# Patient Record
Sex: Female | Born: 1990
Health system: Southern US, Community
[De-identification: ages and names within clinical notes are randomized; demographics above are authoritative.]

## PROBLEM LIST (undated history)

## (undated) ENCOUNTER — Inpatient Hospital Stay (HOSPITAL_COMMUNITY): Payer: Self-pay

## (undated) DIAGNOSIS — O139 Gestational [pregnancy-induced] hypertension without significant proteinuria, unspecified trimester: Secondary | ICD-10-CM

## (undated) DIAGNOSIS — K219 Gastro-esophageal reflux disease without esophagitis: Secondary | ICD-10-CM

## (undated) DIAGNOSIS — F419 Anxiety disorder, unspecified: Secondary | ICD-10-CM

## (undated) DIAGNOSIS — R87629 Unspecified abnormal cytological findings in specimens from vagina: Secondary | ICD-10-CM

## (undated) DIAGNOSIS — Z87442 Personal history of urinary calculi: Secondary | ICD-10-CM

## (undated) DIAGNOSIS — G43909 Migraine, unspecified, not intractable, without status migrainosus: Secondary | ICD-10-CM

## (undated) HISTORY — PX: HERNIA REPAIR: SHX51

## (undated) HISTORY — DX: Gastro-esophageal reflux disease without esophagitis: K21.9

## (undated) HISTORY — PX: NECK SURGERY: SHX720

## (undated) HISTORY — DX: Personal history of urinary calculi: Z87.442

## (undated) HISTORY — DX: Anxiety disorder, unspecified: F41.9

## (undated) HISTORY — PX: SPINE SURGERY: SHX786

---
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
## 2013-01-03 ENCOUNTER — Emergency Department (HOSPITAL_BASED_OUTPATIENT_CLINIC_OR_DEPARTMENT_OTHER)
Admission: EM | Admit: 2013-01-03 | Discharge: 2013-01-04 | Disposition: A | Discharge status: Home / Self Care | Payer: Managed Care, Other (non HMO) | Attending: Emergency Medicine | Admitting: Emergency Medicine

## 2013-01-03 ENCOUNTER — Encounter (HOSPITAL_BASED_OUTPATIENT_CLINIC_OR_DEPARTMENT_OTHER): Payer: Self-pay | Admitting: Emergency Medicine

## 2013-01-03 DIAGNOSIS — Y9389 Activity, other specified: Secondary | ICD-10-CM | POA: Insufficient documentation

## 2013-01-03 DIAGNOSIS — S61209A Unspecified open wound of unspecified finger without damage to nail, initial encounter: Secondary | ICD-10-CM | POA: Insufficient documentation

## 2013-01-03 DIAGNOSIS — W260XXA Contact with knife, initial encounter: Secondary | ICD-10-CM | POA: Insufficient documentation

## 2013-01-03 DIAGNOSIS — Z79899 Other long term (current) drug therapy: Secondary | ICD-10-CM | POA: Insufficient documentation

## 2013-01-03 DIAGNOSIS — G43909 Migraine, unspecified, not intractable, without status migrainosus: Secondary | ICD-10-CM | POA: Insufficient documentation

## 2013-01-03 DIAGNOSIS — Y929 Unspecified place or not applicable: Secondary | ICD-10-CM | POA: Insufficient documentation

## 2013-01-03 DIAGNOSIS — S61012A Laceration without foreign body of left thumb without damage to nail, initial encounter: Secondary | ICD-10-CM

## 2013-01-03 HISTORY — DX: Migraine, unspecified, not intractable, without status migrainosus: G43.909

## 2013-01-03 NOTE — ED Notes (Signed)
Pt has laceration to left thumb with knife tonight.

## 2013-01-04 NOTE — ED Provider Notes (Signed)
  CSN: [id number]     Arrival date & time 01/03/13  2309 History     First MD Initiated Contact with Patient 01/03/13 2341     Chief Complaint  Patient presents with  . Laceration   (Consider location/radiation/quality/duration/timing/severity/associated sxs/prior Treatment) HPI Pt presents with laceration to the tip of her left thumb.  She states she was cutting a watermelon and the knife slipped.  Injury occurred earlier tonight.  Bleeding controlled with pressure.  Pain with palpation.  Pt states her last tetanus shot was 3 years ago.  There are no other associated systemic symptoms, there are no other alleviating or modifying factors.   Past Medical History  Diagnosis Date  . Migraine    Past Surgical History  Procedure Laterality Date  . Hernia repair     No family history on file. History  Substance Use Topics  . Smoking status: Never Smoker   . Smokeless tobacco: Not on file  . Alcohol Use: Yes   OB History   Grav Para Term Preterm Abortions TAB SAB Ect Mult Living                 Review of Systems ROS reviewed and all otherwise negative except for mentioned in HPI  Allergies  Review of patient's allergies indicates no known allergies.  Home Medications   Current Outpatient Rx  Name  Route  Sig  Dispense  Refill  . hydrOXYzine (VISTARIL) 25 MG capsule   Oral   Take 25 mg by mouth as needed for itching.         . Norethindrone Acet-Ethinyl Est (JUNEL 1/20 PO)   Oral   Take by mouth.         . nortriptyline (PAMELOR) 75 MG capsule   Oral   Take 75 mg by mouth at bedtime.          BP 124/84  Pulse 70  Temp(Src) 98.2 F (36.8 C) (Oral)  Resp 18  SpO2 100% Vitals reviewed Physical Exam Physical Examination: General appearance - alert, well appearing, and in no distress Mental status - alert, oriented to person, place, and time Eyes - no conjunctival injection, no scleral icterus Extremities - peripheral pulses normal, no pedal edema, no  clubbing or cyanosis Skin - normal coloration and turgor, no rashes, approx 1cm linear laceration over pad of left thumb  ED Course   Procedures (including critical care time)  LACERATION REPAIR Performed by: Ethelda Chick Authorized by: Ethelda Chick Consent: Verbal consent obtained. Risks and benefits: risks, benefits and alternatives were discussed Consent given by: patient Patient identity confirmed: provided demographic data Prepped and Draped in normal sterile fashion Wound explored  Laceration Location: left thumb  Laceration Length: 1cm  No Foreign Bodies seen or palpated  Irrigation method: syringe Amount of cleaning: standard  Skin closure: dermabond  Number of sutures: dermabond  Technique: dermabond, steri strips  Patient tolerance: Patient tolerated the procedure well with no immediate complications.  Labs Reviewed - No data to display No results found. 1. Laceration of left thumb, initial encounter     MDM  Pt presenting with laceration of left thumb with kitchen knife.  Good wound approximation.  Wound closed with dermabond and steristrips applied.  Tetanus is up to date. Discharged with strict return precautions.  Pt agreeable with plan.  Ethelda Chick, MD 01/04/13 [id number]

## 2015-03-25 DIAGNOSIS — R87619 Unspecified abnormal cytological findings in specimens from cervix uteri: Secondary | ICD-10-CM | POA: Insufficient documentation

## 2015-03-25 DIAGNOSIS — K581 Irritable bowel syndrome with constipation: Secondary | ICD-10-CM | POA: Insufficient documentation

## 2015-03-25 DIAGNOSIS — G43909 Migraine, unspecified, not intractable, without status migrainosus: Secondary | ICD-10-CM | POA: Insufficient documentation

## 2015-09-25 DIAGNOSIS — G43009 Migraine without aura, not intractable, without status migrainosus: Secondary | ICD-10-CM | POA: Insufficient documentation

## 2015-09-25 DIAGNOSIS — M5116 Intervertebral disc disorders with radiculopathy, lumbar region: Secondary | ICD-10-CM | POA: Insufficient documentation

## 2015-10-21 ENCOUNTER — Other Ambulatory Visit: Payer: Self-pay | Admitting: Internal Medicine

## 2015-10-21 ENCOUNTER — Ambulatory Visit
Admission: RE | Admit: 2015-10-21 | Discharge: 2015-10-21 | Disposition: A | Discharge status: Home / Self Care | Payer: BLUE CROSS/BLUE SHIELD | Source: Ambulatory Visit | Attending: Internal Medicine | Admitting: Internal Medicine

## 2015-10-21 DIAGNOSIS — K5900 Constipation, unspecified: Secondary | ICD-10-CM

## 2015-10-27 ENCOUNTER — Ambulatory Visit
Admission: RE | Admit: 2015-10-27 | Discharge: 2015-10-27 | Disposition: A | Discharge status: Home / Self Care | Payer: BLUE CROSS/BLUE SHIELD | Source: Ambulatory Visit | Attending: Internal Medicine | Admitting: Internal Medicine

## 2015-10-27 ENCOUNTER — Other Ambulatory Visit: Payer: Self-pay | Admitting: Internal Medicine

## 2015-10-27 DIAGNOSIS — R109 Unspecified abdominal pain: Secondary | ICD-10-CM

## 2015-10-27 DIAGNOSIS — K5900 Constipation, unspecified: Secondary | ICD-10-CM

## 2017-05-05 IMAGING — CR DG ABDOMEN 2V
2 series · 2 of 2 positions shown · non-contrast
Comparison: 01/06/2004

CLINICAL DATA: Chronic constipation, assess fecal burden

EXAM:
ABDOMEN - 2 VIEW

[w abdomen upright]
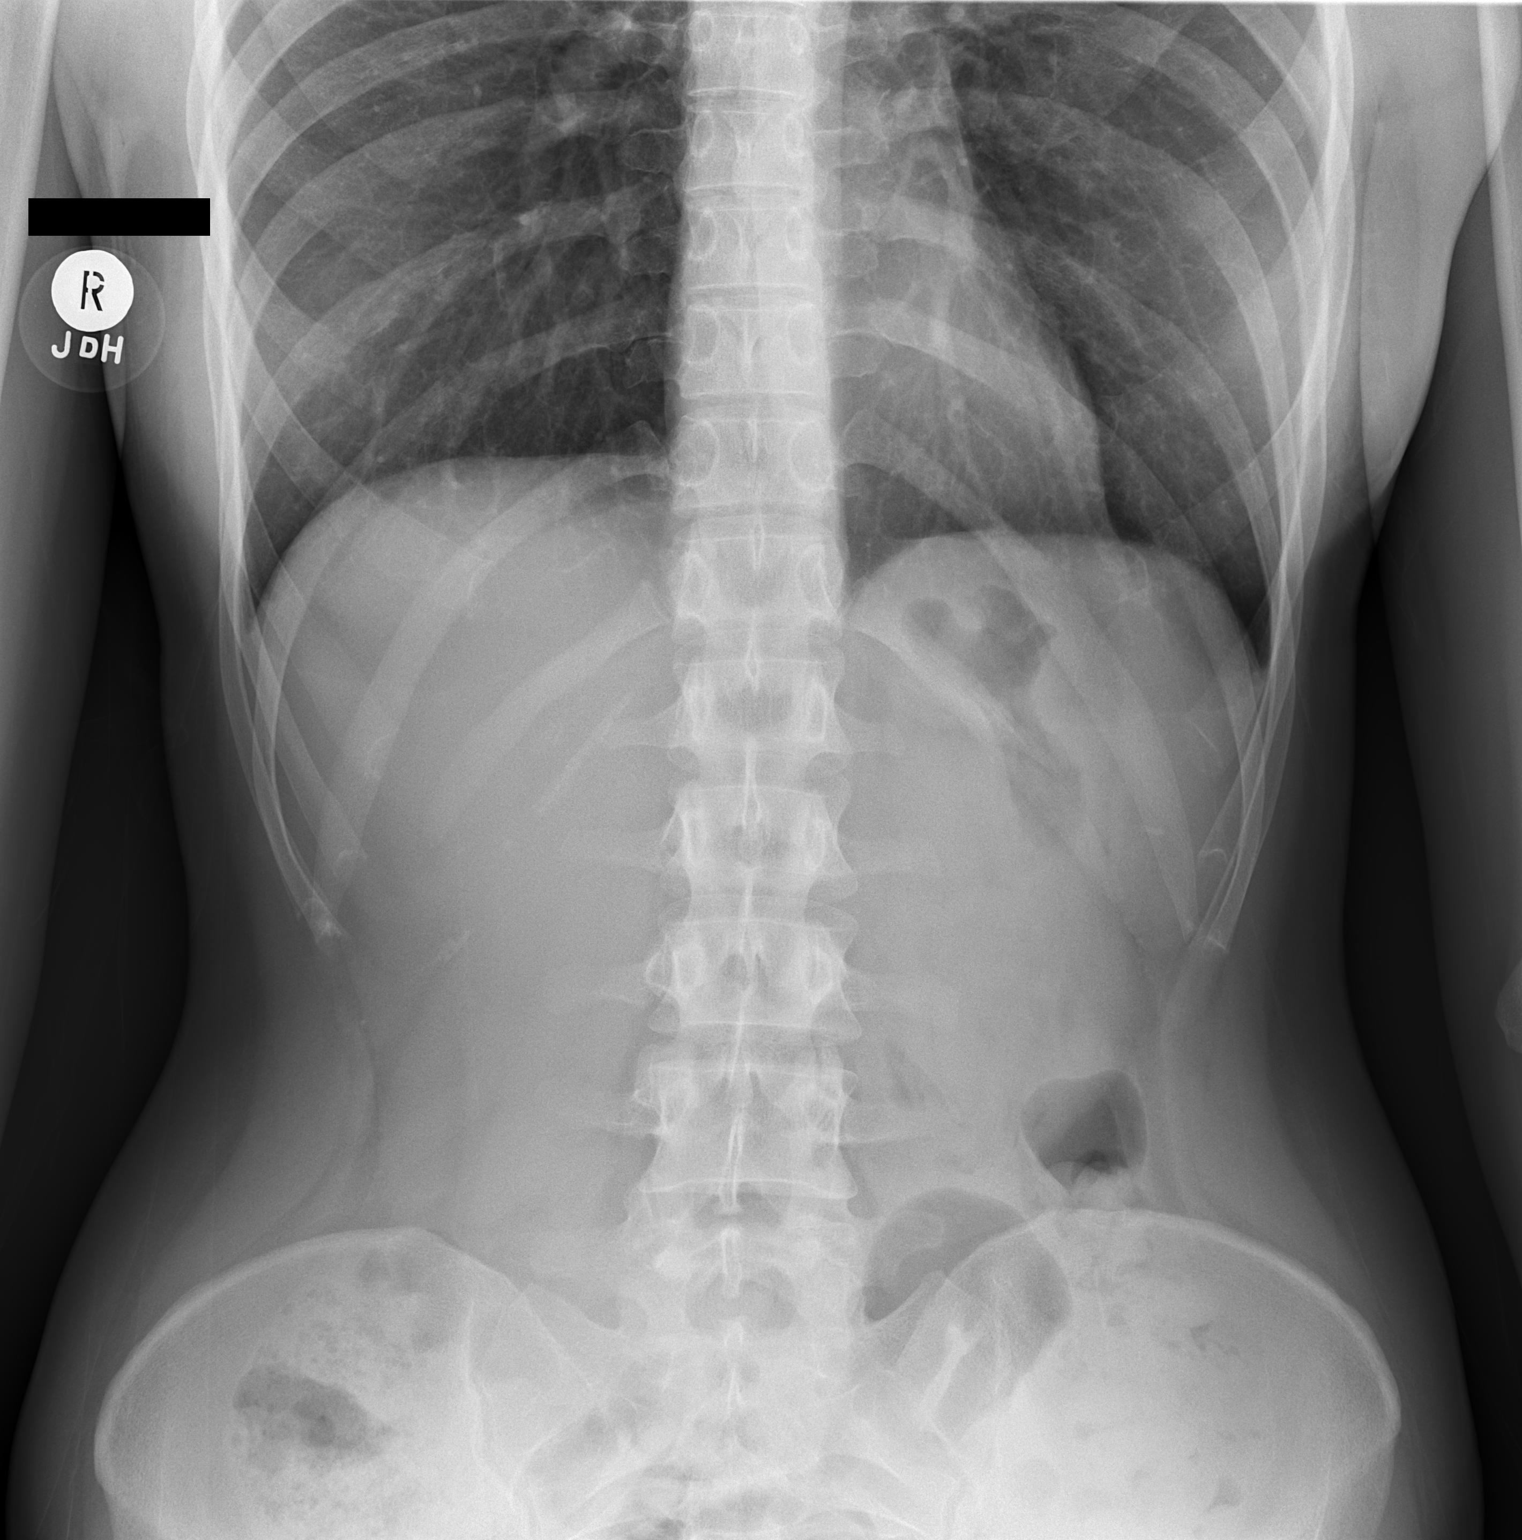

[t abdomen supine]
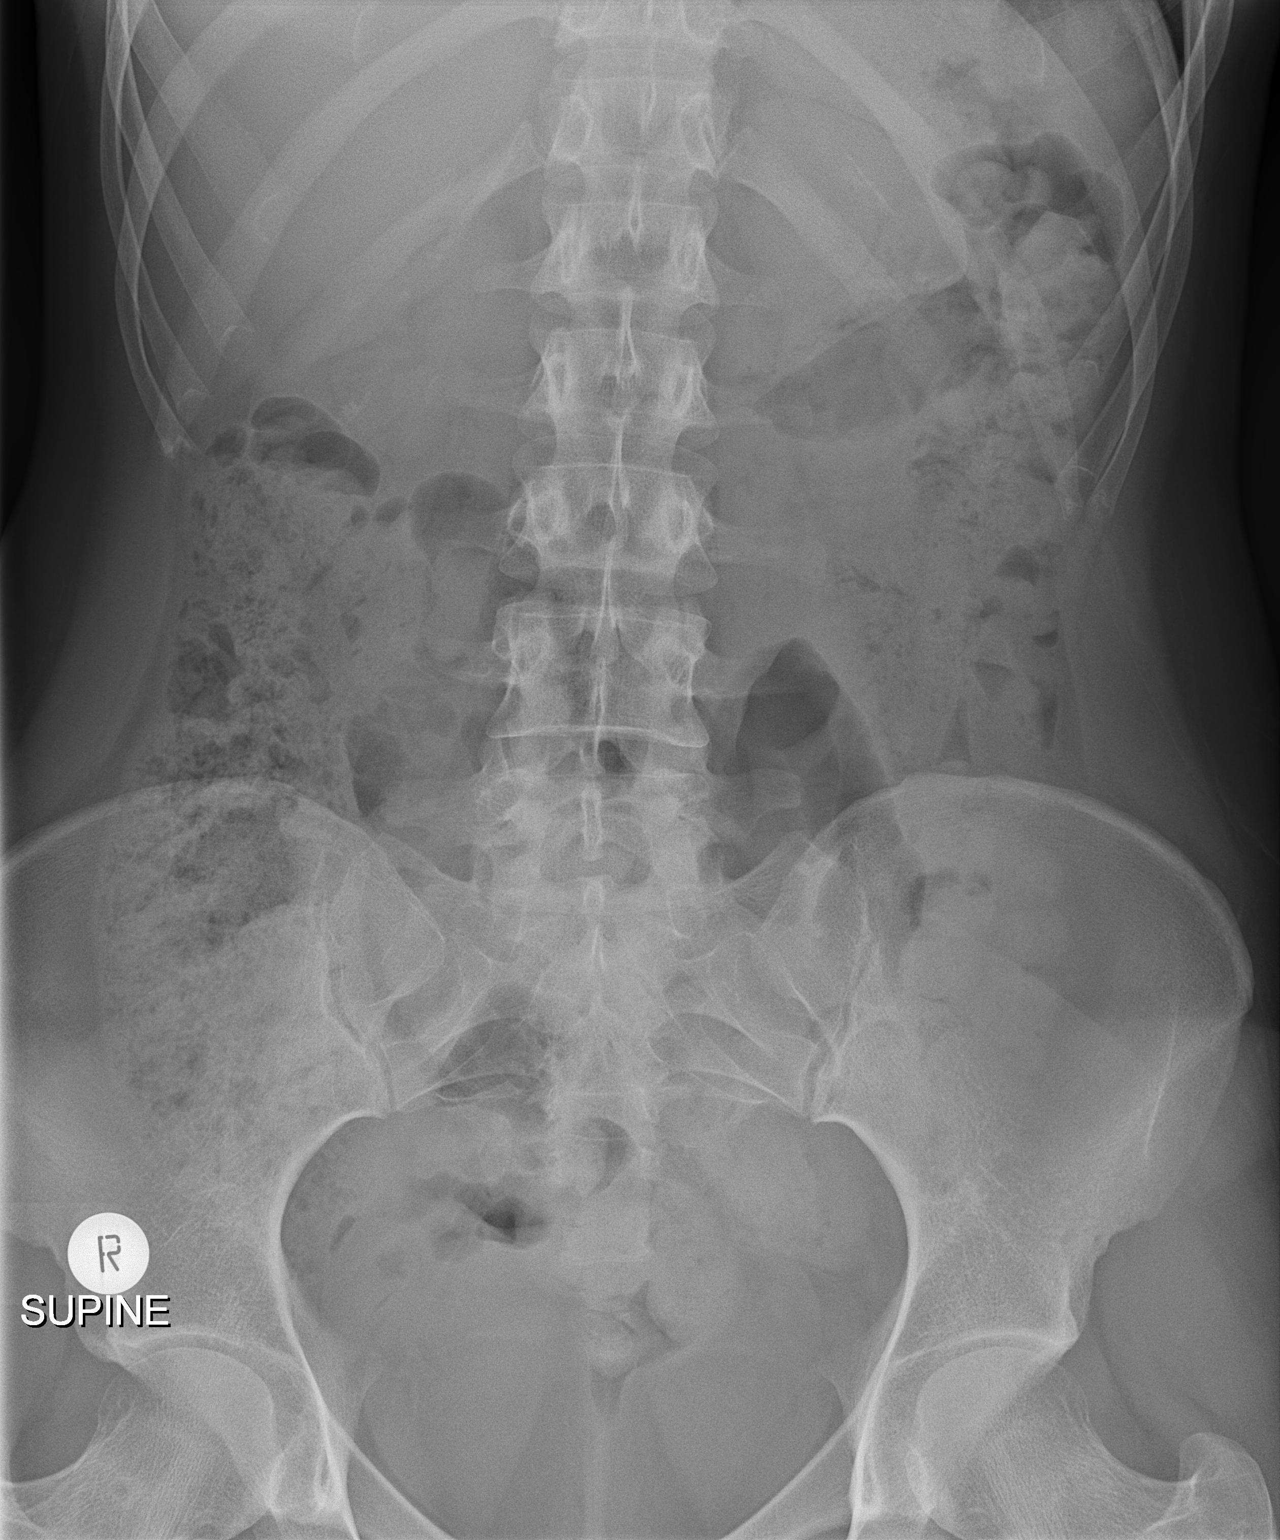

[2 of 2 positions shown; findings below may reference images not displayed]

FINDINGS: There is normal small bowel gas pattern. Abundant stool noted in
right colon. Moderate stool noted in splenic flexure and proximal
left colon. No evidence of free abdominal air.
IMPRESSION: Normal small bowel gas pattern.  Colonic stool as described above.

## 2017-05-19 ENCOUNTER — Encounter: Payer: Self-pay | Admitting: Podiatry

## 2017-05-19 ENCOUNTER — Ambulatory Visit: Payer: BLUE CROSS/BLUE SHIELD | Admitting: Podiatry

## 2017-05-19 VITALS — BP 120/89 | HR 101 | Ht 70.0 in | Wt 150.0 lb

## 2017-05-19 DIAGNOSIS — L600 Ingrowing nail: Secondary | ICD-10-CM | POA: Diagnosis not present

## 2017-05-19 DIAGNOSIS — B351 Tinea unguium: Secondary | ICD-10-CM

## 2017-05-19 NOTE — Progress Notes (Signed)
  Subjective:  Patient ID: Norma Weber, female    DOB: 1991-01-22,  MRN: [id number]  Chief Complaint  Patient presents with  . Nail Problem    left foot toenail fungus/great toe -has become painful    27 y.o. female presents with and ingrown toenail to the left great toe.  Has been painful for some time.  Reports that she thinks she has a fungus in her nail. Past Medical History:  Diagnosis Date  . Migraine    Past Surgical History:  Procedure Laterality Date  . HERNIA REPAIR      Current Outpatient Medications:  .  ALPRAZolam (XANAX) 0.25 MG tablet, Take by mouth., Disp: , Rfl:  .  linaclotide (LINZESS) 72 MCG capsule, TAKE 1 CAPSULE BY MOUTH EVERY DAY, Disp: , Rfl:  .  norgestimate-ethinyl estradiol (SPRINTEC 28) 0.25-35 MG-MCG tablet, Take by mouth., Disp: , Rfl:  .  hydrOXYzine (VISTARIL) 25 MG capsule, Take 25 mg by mouth as needed for itching., Disp: , Rfl:  .  ibuprofen (ADVIL,MOTRIN) 800 MG tablet, Take by mouth., Disp: , Rfl:  .  Norethindrone Acet-Ethinyl Est (JUNEL 1/20 PO), Take by mouth., Disp: , Rfl:  .  nortriptyline (PAMELOR) 75 MG capsule, Take 75 mg by mouth at bedtime., Disp: , Rfl:   No Known Allergies Review of Systems all other systems reviewed and are negative except as noted Objective:   Vitals:   05/19/17 1058  BP: 120/89  Pulse: (!) 101   General AA&O x3. Normal mood and affect.  Vascular Dorsalis pedis and posterior tibial pulses  present 2+ bilaterally  Capillary refill normal to all digits. Pedal hair growth normal.  Neurologic Epicritic sensation grossly present.  Dermatologic No open lesions. Interspaces clear of maceration. Nails well groomed and normal in appearance. Painful ingrowing nail at both nail borders of the hallux nail left.  Orthopedic: MMT 5/5 in dorsiflexion, plantarflexion, inversion, and eversion. Normal joint ROM without pain or crepitus. Pain to palpation about the ingrown nail.   Assessment & Plan:  Patient was  evaluated and treated and all questions answered.  Ingrown Nail, left -Patient elects to proceed with ingrown toenail removal today -Ingrown nail excised. See procedure note. -Educated on post-procedure care including soaking. Written instructions provided. -Patient to follow up in 2 weeks for nail check.  Onychomycosis -Nail sample sent for culture and micro  Procedure: Excision of Ingrown Toenail Location: Left 1st toe both nail borders. Anesthesia: Lidocaine 1% plain; 1.5 mL and Marcaine 0.5% plain; 1.5 mL, digital block. Skin Prep: Betadine. Dressing: Silvadene; telfa; dry, sterile, compression dressing. Technique: Following skin prep, the toe was exsanguinated and a tourniquet was secured at the base of the toe. The affected nail border was freed, split with a nail splitter, and excised. Chemical matrixectomy was then performed with phenol and irrigated out with alcohol. The tourniquet was then removed and sterile dressing applied. Disposition: Patient tolerated procedure well. Patient to return in 2 weeks for follow-up.   Return in about 2 weeks (around 06/02/2017) for Nail Check.

## 2017-05-19 NOTE — Patient Instructions (Signed)
Place 1/4 cup of epsom salts in a quart of warm tap water.  Submerge your foot or feet in the solution and soak for 20 minutes.  This soak should be done twice a day.  Next, remove your foot or feet from solution, blot dry the affected area. Apply ointment and cover if instructed by your doctor.   IF YOUR SKIN BECOMES IRRITATED WHILE USING THESE INSTRUCTIONS, IT IS OKAY TO SWITCH TO  WHITE VINEGAR AND WATER.  As another alternative soak, you may use antibacterial soap and water.  Monitor for any signs/symptoms of infection. Call the office immediately if any occur or go directly to the emergency room. Call with any questions/concerns.  Ingrown Toenail An ingrown toenail occurs when the corner or sides of your toenail grow into the surrounding skin. The big toe is most commonly affected, but it can happen to any of your toes. If your ingrown toenail is not treated, you will be at risk for infection. What are the causes? This condition may be caused by:  Wearing shoes that are too small or tight.  Injury or trauma, such as stubbing your toe or having your toe stepped on.  Improper cutting or care of your toenails.  Being born with (congenital) nail or foot abnormalities, such as having a nail that is too big for your toe.  What increases the risk? Risk factors for an ingrown toenail include:  Age. Your nails tend to thicken as you get older, so ingrown nails are more common in older people.  Diabetes.  Cutting your toenails incorrectly.  Blood circulation problems.  What are the signs or symptoms? Symptoms may include:  Pain, soreness, or tenderness.  Redness.  Swelling.  Hardening of the skin surrounding the toe.  Your ingrown toenail may be infected if there is fluid, pus, or drainage. How is this diagnosed? An ingrown toenail may be diagnosed by medical history and physical exam. If your toenail is infected, your health care provider may test a sample of the  drainage. How is this treated? Treatment depends on the severity of your ingrown toenail. Some ingrown toenails may be treated at home. More severe or infected ingrown toenails may require surgery to remove all or part of the nail. Infected ingrown toenails may also be treated with antibiotic medicines. Follow these instructions at home:  If you were prescribed an antibiotic medicine, finish all of it even if you start to feel better.  Soak your foot in warm soapy water for 20 minutes, 3 times per day or as directed by your health care provider.  Carefully lift the edge of the nail away from the sore skin by wedging a small piece of cotton under the corner of the nail. This may help with the pain. Be careful not to cause more injury to the area.  Wear shoes that fit well. If your ingrown toenail is causing you pain, try wearing sandals, if possible.  Trim your toenails regularly and carefully. Do not cut them in a curved shape. Cut your toenails straight across. This prevents injury to the skin at the corners of the toenail.  Keep your feet clean and dry.  If you are having trouble walking and are given crutches by your health care provider, use them as directed.  Do not pick at your toenail or try to remove it yourself.  Take medicines only as directed by your health care provider.  Keep all follow-up visits as directed by your health care provider. This   is important. Contact a health care provider if:  Your symptoms do not improve with treatment. Get help right away if:  You have red streaks that start at your foot and go up your leg.  You have a fever.  You have increased redness, swelling, or pain.  You have fluid, blood, or pus coming from your toenail. This information is not intended to replace advice given to you by your health care provider. Make sure you discuss any questions you have with your health care provider. Document Released: 04/30/2000 Document Revised:  10/03/2015 Document Reviewed: 03/27/2014 Elsevier Interactive Patient Education  2018 [employer]  

## 2017-05-20 ENCOUNTER — Telehealth: Payer: Self-pay | Admitting: Podiatry

## 2017-05-20 NOTE — Telephone Encounter (Signed)
I saw Dr. Samuella CotaPrice yesterday and he performed a nail procedure on my big toe. I just have a few questions regarding follow up care. If someone could return my call at [phone]. Thank you.

## 2017-05-20 NOTE — Telephone Encounter (Signed)
Pt states she began her soaks today and she still has quite a bit of drainage. I told pt she would have varying degrees of drainage for up to 4-6 weeks, to continue the soaks 2 x daily for 20 minutes, then pat dry and cover with an antibiotic ointment bandaid. I told pt the symptoms should gradually decrease and at about the end of the 4th week, she should do the last soak of the day, leave off the antibiotic ointment bandaid and allow to air dry, if no redness, swelling, pain or drainage then can stop the soaks, if symptoms continue then continue the soaks for another 2 weeks and test again.

## 2017-06-02 ENCOUNTER — Ambulatory Visit: Payer: 59 | Admitting: Podiatry

## 2017-06-02 ENCOUNTER — Encounter: Payer: Self-pay | Admitting: Podiatry

## 2017-06-02 DIAGNOSIS — B351 Tinea unguium: Secondary | ICD-10-CM

## 2017-06-02 MED ORDER — FLUCONAZOLE 150 MG PO TABS: 150.0000 mg | ORAL_TABLET | ORAL | 1 refills | Status: DC

## 2017-06-02 NOTE — Progress Notes (Signed)
  Subjective:  Patient ID: Norma Weber, female    DOB: 09-27-1990,  MRN: [id number]  Chief Complaint  Patient presents with  . Ingrown Toenail    2 week follow up   27 y.o. female returns for the above complaint. States that the nail is doing fine.  Objective:   General AA&O x3. Normal mood and affect.  Vascular Foot warm and well perfused with good capillary refill.  Neurologic Sensation grossly intact.  Dermatologic Nail avulsion site healing well without drainage or erythema. Nail bed with overlying soft crust. Left intact. No signs of local infection.  Orthopedic: No tenderness to palpation of the toe.   Assessment & Plan:  Patient was evaluated and treated and all questions answered.  S/p Ingrown Toenail Excision, left both borders. -Healing well without issue. -Discussed return precautions. -F/u PRN  Onychomycosis -Nail histology and microbiology reviewed. S/o Onychomycosis. -Rx weekly Fluconazole.

## 2017-06-07 DIAGNOSIS — F411 Generalized anxiety disorder: Secondary | ICD-10-CM | POA: Insufficient documentation

## 2017-06-07 DIAGNOSIS — F410 Panic disorder [episodic paroxysmal anxiety] without agoraphobia: Secondary | ICD-10-CM | POA: Insufficient documentation

## 2017-06-23 DIAGNOSIS — M5136 Other intervertebral disc degeneration, lumbar region: Secondary | ICD-10-CM | POA: Insufficient documentation

## 2017-07-14 ENCOUNTER — Ambulatory Visit: Payer: 59 | Admitting: Podiatry

## 2017-07-28 ENCOUNTER — Telehealth: Payer: Self-pay | Admitting: *Deleted

## 2017-07-28 ENCOUNTER — Ambulatory Visit: Payer: 59 | Admitting: Podiatry

## 2017-07-28 DIAGNOSIS — L600 Ingrowing nail: Secondary | ICD-10-CM | POA: Diagnosis not present

## 2017-07-28 DIAGNOSIS — B351 Tinea unguium: Secondary | ICD-10-CM | POA: Diagnosis not present

## 2017-07-28 MED ORDER — EFINACONAZOLE 10 % EX SOLN: CUTANEOUS | 11 refills | Status: DC

## 2017-07-28 NOTE — Progress Notes (Signed)
  Subjective:  Patient ID: Norma RipaJessica L Weber, female    DOB: January 24, 1991,  MRN: [id number]  Chief Complaint  Patient presents with  . Nail Problem    Toenails look a little better - had to stop taking fluconazole after 5 doses due to extremem dry mouth   27 y.o. female returns for the above complaint. States the nail fungus is improving.  Had to stop the fluconazole due to dryness from mouth dryness with her other medications.  Objective:  There were no vitals filed for this visit. General AA&O x3. Normal mood and affect.  Vascular Pedal pulses palpable.  Neurologic Epicritic sensation grossly intact.  Dermatologic Nail discoloration improving bilat hallux nails, R 2nd toenail  Orthopedic: No pain to palpation either foot.   Assessment & Plan:  Patient was evaluated and treated and all questions answered.  Onychomycosis -Improving -Courtesy nail debridement today with burr -D/c Fluconazole. Rx Jublia  Return in about 5 weeks (around 09/01/2017) for Nail Fungus.

## 2017-07-28 NOTE — Telephone Encounter (Signed)
Dr. Samuella CotaPrice ordered Norma Weber. Orders sent to [facility], [place].

## 2017-08-01 ENCOUNTER — Telehealth: Payer: Self-pay | Admitting: *Deleted

## 2017-08-01 NOTE — Telephone Encounter (Signed)
Neysa BonitoChristy - [facility] request copy of Fungal culture for pre-cert for the Jublia. Faxed copy of 05/19/2017 fungal results to [facility].

## 2017-08-05 NOTE — Telephone Encounter (Signed)
[employer] denied [place]ublia, suggest alternate topical Penlac/Ciclopirox.

## 2017-08-15 MED ORDER — CICLOPIROX 8 % EX SOLN: Freq: Every day | CUTANEOUS | 11 refills | Status: DC

## 2017-08-15 NOTE — Addendum Note (Signed)
Addended by: Alphia Kava'CONNELL, VALERY D on: 08/15/2017 02:49 PM   Modules accepted: Orders

## 2017-08-15 NOTE — Telephone Encounter (Signed)
Pt states she had missed my call.

## 2017-08-15 NOTE — Telephone Encounter (Addendum)
Left message informing pt the [employer] had denied the [place], and suggested penlac. Dr. Samuella CotaPrice orderd the penlac and [phone]

## 2017-08-15 NOTE — Telephone Encounter (Signed)
Sorry missed this. We can do Penlac

## 2017-08-15 NOTE — Telephone Encounter (Signed)
Left message to call for information concerning rx.

## 2017-09-01 ENCOUNTER — Encounter: Payer: Self-pay | Admitting: Podiatry

## 2017-09-01 ENCOUNTER — Ambulatory Visit: Payer: 59 | Admitting: Podiatry

## 2017-09-01 DIAGNOSIS — B351 Tinea unguium: Secondary | ICD-10-CM

## 2017-09-08 NOTE — Progress Notes (Signed)
  Subjective:  Patient ID: Norma Weber, female    DOB: 1991-02-07,  MRN: [id number]  Chief Complaint  Patient presents with  . Nail Problem    left foot f/u appt. doing better, yellowish color going away, using topical 1 week   27 y.o. female returns for the above complaint. States the fungus is improving. Color going away. Has been using topical medication. Jublia was not covered.  Objective:  There were no vitals filed for this visit. General AA&O x3. Normal mood and affect.  Vascular Pedal pulses palpable.  Neurologic Epicritic sensation grossly intact.  Dermatologic Nail discoloration improving bilat hallux nails, R 2nd toenail  Orthopedic: No pain to palpation either foot.   Assessment & Plan:  Patient was evaluated and treated and all questions answered.  Onychomycosis -Improving -Courtesy nail debridement today with burr -Continue topical.  Return if symptoms worsen or fail to improve.

## 2017-11-01 DIAGNOSIS — R310 Gross hematuria: Secondary | ICD-10-CM | POA: Insufficient documentation

## 2017-12-09 ENCOUNTER — Ambulatory Visit (HOSPITAL_COMMUNITY): Payer: BLUE CROSS/BLUE SHIELD | Admitting: Psychiatry

## 2017-12-09 ENCOUNTER — Encounter

## 2017-12-23 ENCOUNTER — Encounter (HOSPITAL_COMMUNITY): Payer: Self-pay | Admitting: Psychiatry

## 2017-12-23 ENCOUNTER — Ambulatory Visit (INDEPENDENT_AMBULATORY_CARE_PROVIDER_SITE_OTHER): Payer: 59 | Admitting: Psychiatry

## 2017-12-23 VITALS — BP 118/66 | HR 80 | Ht 69.5 in | Wt 138.0 lb

## 2017-12-23 DIAGNOSIS — Z818 Family history of other mental and behavioral disorders: Secondary | ICD-10-CM

## 2017-12-23 DIAGNOSIS — F411 Generalized anxiety disorder: Secondary | ICD-10-CM | POA: Diagnosis not present

## 2017-12-23 DIAGNOSIS — Q798 Other congenital malformations of musculoskeletal system: Secondary | ICD-10-CM

## 2017-12-23 DIAGNOSIS — R450 Nervousness: Secondary | ICD-10-CM

## 2017-12-23 DIAGNOSIS — K5900 Constipation, unspecified: Secondary | ICD-10-CM

## 2017-12-23 DIAGNOSIS — R002 Palpitations: Secondary | ICD-10-CM

## 2017-12-23 DIAGNOSIS — R51 Headache: Secondary | ICD-10-CM

## 2017-12-23 LAB — CBC WITH DIFFERENTIAL/PLATELET
BASOS: 0 %
Basophils Absolute: 0 10*3/uL (ref 0.0–0.2)
EOS (ABSOLUTE): 0 10*3/uL (ref 0.0–0.4)
Eos: 0 %
HEMATOCRIT: 42.7 % (ref 34.0–46.6)
Hemoglobin: 14.8 g/dL (ref 11.1–15.9)
IMMATURE GRANS (ABS): 0 10*3/uL (ref 0.0–0.1)
IMMATURE GRANULOCYTES: 0 %
LYMPHS: 20 %
Lymphocytes Absolute: 1.2 10*3/uL (ref 0.7–3.1)
MCH: 30.8 pg (ref 26.6–33.0)
MCHC: 34.7 g/dL (ref 31.5–35.7)
MCV: 89 fL (ref 79–97)
Monocytes Absolute: 0.2 10*3/uL (ref 0.1–0.9)
Monocytes: 3 %
NEUTROS ABS: 4.7 10*3/uL (ref 1.4–7.0)
NEUTROS PCT: 77 %
Platelets: 187 10*3/uL (ref 150–450)
RBC: 4.8 x10E6/uL (ref 3.77–5.28)
RDW: 13.4 % (ref 12.3–15.4)
WBC: 6.2 10*3/uL (ref 3.4–10.8)

## 2017-12-23 LAB — SEDIMENTATION RATE: Sed Rate: 2 mm/hr (ref 0–32)

## 2017-12-23 MED ORDER — ESCITALOPRAM OXALATE 20 MG PO TABS: 20.0000 mg | ORAL_TABLET | Freq: Every day | ORAL | 0 refills | Status: DC

## 2017-12-23 MED ORDER — NORTRIPTYLINE HCL 50 MG PO CAPS: 50.0000 mg | ORAL_CAPSULE | Freq: Every day | ORAL | Status: DC

## 2017-12-23 MED ORDER — ALPRAZOLAM 0.25 MG PO TABS: 0.2500 mg | ORAL_TABLET | Freq: Two times a day (BID) | ORAL | 0 refills | Status: DC | PRN

## 2017-12-23 NOTE — Progress Notes (Signed)
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 GAD (generalized anxiety disorder) F41.1 escitalopram (LEXAPRO) 20 MG tablet    History of Present Illness:  Norma Weber is a 27 year old female with a psychiatric history of generalized anxiety disorder and panic attacks last treated about 10 years ago when she was on Lexapro for about 6 months.  She felt fairly well on the Lexapro and tolerated the medicine without any significant issues.  She presents today requesting medication management assessment.  She has been trying to cope with her anxiety and panic using breathing exercises, intellectualization, rational thinking, mindfulness, but feels like she is hit a wall.  She struggles with comorbid migraine headaches, and some depressive symptoms.  She denies any suicidality, but does feel hopeless and is tearful in describing how overwhelmed she feels with her anxiety.  She has a good support system in family support environment, has been married for 1 year and together with her husband for 5 years.  They are considering having children so we discussed safe planning for pregnancy in terms of medication management.  She denies any substance abuse, does not drink alcohol or use any other illicit drugs.  She is prescribed Xanax but uses it perhaps 2-3 times per month because she is very "stubborn" and does  not want to rely on medications to help with her anxiety.  She realizes that she has a genetic disposition towards anxiety and depression and a strong family history, so she needs to address this with a medication.  She has been in therapy in the past which has been helpful and is considering restarting therapy as well.  She has no history of psychotic symptoms or mania, does not present with any OCD-like thought patterns or any significant trauma history contributing to her presentation.  We discussed reintroducing Lexapro and titrating to 20 mg daily, and I also encouraged her to use the Xanax twice a day as needed particularly during the first few weeks of SSRI to help reduce her anxiety.  Of note, the patient presents with a tall habitus, long torso, long legs, and elongated face and neck.  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  She does describe palpitations when she has anxiety attacks.  She struggles with irritable bowel syndrome with predominant constipation.  I spent time with her expressing some concern about connective tissue disease and suggested that she would benefit from genetic assessment which she was receptive to.  I also suggested some laboratory studies to assess for other  contributing factors to depression and specifically inquired if she had a family history of Marfan syndrome.  She was unaware of any history.  Disclosed to patient that this Probation officer is leaving this practice at the end of August 2019, and patients always has the right to choose their provider. Reassured patient that office will work to provide smooth transition of care whether they wish to remain at this office, or to continue with this provider, or seek alternative care options  in community.  They expressed understanding.  We agreed to proceed as below with regard to medication management and laboratory studies and we will follow-up in 4 weeks.   Associated Signs/Symptoms: Depression Symptoms:  depressed mood, fatigue, difficulty concentrating, hopelessness, anxiety, panic attacks, (Hypo) Manic Symptoms:  Irritable Mood, Anxiety Symptoms:  Excessive Worry, Panic Symptoms, Social Anxiety, Psychotic Symptoms:  none PTSD Symptoms: Negative  Past Psychiatric History: outpatient lexapro, 10 years ago  Previous Psychotropic Medications: Yes   Substance Abuse History in the last 12 months:  No.  Consequences of Substance Abuse: Negative  Past Medical History:  Past Medical History:  Diagnosis Date  . Anxiety   . Migraine     Past Surgical History:  Procedure Laterality Date  . HERNIA REPAIR      Family Psychiatric History: mom/dad have anxiety and depression  Family History: History reviewed. No pertinent family history.  Social History:   Social History   Socioeconomic History  . Marital status: Single    Spouse name: Not on file  . Number of children: Not on file  . Years of education: Not on file  . Highest education level: Not on file  Occupational History  . Not on file  Social Needs  . Financial resource strain: Not on file  . Food insecurity:    Worry: Not on file    Inability: Not on file  . Transportation needs:    Medical: Not on file    Non-medical: Not on file  Tobacco Use  . Smoking status: Never Smoker  . Smokeless tobacco: Never Used  Substance and Sexual Activity  . Alcohol use: Not Currently  . Drug use: No  . Sexual activity: Yes  Lifestyle  . Physical activity:    Days per week: Not on file    Minutes per session: Not on file  . Stress: Not on file  Relationships  . Social connections:    Talks on phone: Not on file    Gets together: Not on file    Attends religious service: Not on file    Active  member of club or organization: Not on file    Attends meetings of clubs or organizations: Not on file    Relationship status: Not on file  Other Topics Concern  . Not on file  Social History Narrative  . Not on file    Additional Social History: works full time, married, no kids, planning for pregnancy in coming years, no substance abuse  Allergies:  No Known Allergies  Metabolic Disorder Labs: No results found for: HGBA1C, MPG No results found for: PROLACTIN No results found for: CHOL, TRIG, HDL, CHOLHDL, VLDL, LDLCALC   Current Medications: Current Outpatient Medications  Medication Sig Dispense Refill  . ALPRAZolam (XANAX) 0.25 MG tablet Take 1 tablet (0.25 mg total) by mouth 2 (two) times daily as needed for anxiety. 60 tablet 0  . ibuprofen (ADVIL,MOTRIN) 800 MG tablet Take by mouth.    . linaclotide (LINZESS) 72 MCG capsule TAKE 1 CAPSULE BY  MOUTH EVERY DAY    . norgestimate-ethinyl estradiol (SPRINTEC 28) 0.25-35 MG-MCG tablet Take by mouth.    . nortriptyline (PAMELOR) 50 MG capsule Take 1 capsule (50 mg total) by mouth at bedtime.    . ciclopirox (PENLAC) 8 % solution Apply topically at bedtime. Apply over nail and surrounding skin. Apply daily over previous coat. After seven (7) days, may remove with alcohol and continue cycle. (Patient not taking: Reported on 12/23/2017) 6.6 mL 11  . Efinaconazole (JUBLIA) 10 % SOLN Apply one drop or brush to affected area, remove every 7 days with alcohol, repeat. (Patient not taking: Reported on 12/23/2017) 1 Bottle 11  . escitalopram (LEXAPRO) 20 MG tablet Take 1 tablet (20 mg total) by mouth daily. 90 tablet 0   No current facility-administered medications for this visit.     Neurologic: Headache: Yes Seizure: Negative Paresthesias:Negative  Musculoskeletal: Strength & Muscle Tone: within normal limits Gait & Station: normal Patient leans: N/A  Psychiatric Specialty Exam: Review of Systems  Constitutional: Negative.   HENT:  Negative.   Respiratory: Negative.   Cardiovascular: Positive for palpitations.  Gastrointestinal: Positive for constipation.  Neurological: Positive for headaches.  Endo/Heme/Allergies: Positive for environmental allergies. Bruises/bleeds easily.  Psychiatric/Behavioral: Positive for depression. The patient is nervous/anxious.        Low appetite     Blood pressure 118/66, pulse 80, height 5' 9.5" (1.765 m), weight 138 lb (62.6 kg).Body mass index is 20.09 kg/m.  General Appearance: Casual and tall, long extremities, long head/neck  Eye Contact:  Good  Speech:  Clear and Coherent and Normal Rate  Volume:  Normal  Mood:  Anxious and Dysphoric  Affect:  Congruent and Tearful  Thought Process:  Coherent, Goal Directed and Descriptions of Associations: Intact  Orientation:  Full (Time, Place, and Person)  Thought Content:  Logical  Suicidal Thoughts:  No  Homicidal Thoughts:  No  Memory:  Immediate;   Fair  Judgement:  Fair  Insight:  Fair  Psychomotor Activity:  Normal  Concentration:  Concentration: Fair  Recall:  Good  Fund of Knowledge:Good  Language: Good  Akathisia:  Negative  Handed:  Right  AIMS (if indicated):  n/a  Assets:  Communication Skills Desire for Improvement Financial Resources/Insurance Housing  ADL's:  Intact  Cognition: WNL  Sleep:  fair    Treatment Plan Summary: Nelly Rout  1. Connective tissue anomaly   2. GAD (generalized anxiety disorder)     Status of current problems: new patient  Labs Ordered: Orders Placed This Encounter  Procedures  . CBC with Differential    Standing Status:   Future    Number of Occurrences:   1    Standing Expiration Date:   12/24/2018  . Comprehensive metabolic panel    Standing Status:   Future    Number of Occurrences:   1    Standing Expiration Date:   12/24/2018  . Sed Rate (ESR)    Standing Status:   Future    Number of Occurrences:   1    Standing Expiration Date:   12/24/2018  . C-reactive  protein    Standing Status:   Future    Number of Occurrences:   1    Standing Expiration Date:   12/24/2018  . TSH + free T4    Standing Status:   Future    Number of Occurrences:   1    Standing Expiration Date:   12/24/2018  . Vitamin D 1,25 dihydroxy  Standing Status:   Future    Number of Occurrences:   1    Standing Expiration Date:   12/24/2018  . B12 and Folate Panel    Standing Status:   Future    Number of Occurrences:   1    Standing Expiration Date:   12/24/2018  . Genetic counseling    Standing Status:   Future    Standing Expiration Date:   12/24/2018    Labs Reviewed: n/a  Collateral Obtained/Records Reviewed: n/a  Plan:  Initiate Lexapro 10 mg x 3 days, then increase to 20 mg Okay to use Xanax 0.25 mg twice a day as needed, particularly would suggest the patient use this more liberally during the first week or 2 when Lexapro is beginning to take effect Follow-up in 4 weeks for medication management Encourage the patient to consider restarting individual therapy Spent time with the patient educating her on medical contributors to anxiety and concerns regarding her presentation and possible connective tissue contributors Spent time discussing peripartum planning in the event she wishes to get pregnant Referral to genetic counseling/OB  Aundra Dubin, MD 8/9/20199:52 AM

## 2017-12-24 LAB — COMPREHENSIVE METABOLIC PANEL
ALBUMIN: 4.8 g/dL (ref 3.5–5.5)
ALT: 9 IU/L (ref 0–32)
AST: 12 IU/L (ref 0–40)
Albumin/Globulin Ratio: 1.8 (ref 1.2–2.2)
Alkaline Phosphatase: 73 IU/L (ref 39–117)
BUN / CREAT RATIO: 13 (ref 9–23)
BUN: 10 mg/dL (ref 6–20)
Bilirubin Total: 0.4 mg/dL (ref 0.0–1.2)
CALCIUM: 9.8 mg/dL (ref 8.7–10.2)
CO2: 21 mmol/L (ref 20–29)
CREATININE: 0.78 mg/dL (ref 0.57–1.00)
Chloride: 104 mmol/L (ref 96–106)
GFR, EST AFRICAN AMERICAN: 119 mL/min/{1.73_m2} (ref >59)
GFR, EST NON AFRICAN AMERICAN: 103 mL/min/{1.73_m2} (ref >59)
GLUCOSE: 104 mg/dL — AB (ref 65–99)
Globulin, Total: 2.7 g/dL (ref 1.5–4.5)
Potassium: 4.2 mmol/L (ref 3.5–5.2)
Sodium: 140 mmol/L (ref 134–144)
TOTAL PROTEIN: 7.5 g/dL (ref 6.0–8.5)

## 2017-12-24 LAB — C-REACTIVE PROTEIN: CRP: 2 mg/L (ref 0–10)

## 2017-12-24 LAB — TSH+FREE T4
Free T4: 1.31 ng/dL (ref 0.82–1.77)
TSH: 1.3 u[IU]/mL (ref 0.450–4.500)

## 2017-12-28 LAB — VITAMIN D 1,25 DIHYDROXY
VITAMIN D3 1, 25 (OH): 67 pg/mL
Vitamin D 1, 25 (OH)2 Total: 67 pg/mL — ABNORMAL HIGH
Vitamin D2 1, 25 (OH)2: <10 pg/mL

## 2018-01-12 LAB — B12 AND FOLATE PANEL: Vitamin B-12: 339 pg/mL (ref 232–1245)

## 2018-01-12 LAB — FBN1: MARFAN SYNDROME

## 2018-07-07 DIAGNOSIS — M5412 Radiculopathy, cervical region: Secondary | ICD-10-CM | POA: Insufficient documentation

## 2018-12-11 LAB — OB RESULTS CONSOLE GC/CHLAMYDIA
Chlamydia: NEGATIVE
Gonorrhea: NEGATIVE

## 2018-12-11 LAB — OB RESULTS CONSOLE ABO/RH: "RH Type ": NEGATIVE

## 2018-12-11 LAB — OB RESULTS CONSOLE RPR: RPR: NONREACTIVE

## 2018-12-11 LAB — OB RESULTS CONSOLE HIV ANTIBODY (ROUTINE TESTING): HIV: NONREACTIVE

## 2018-12-11 LAB — OB RESULTS CONSOLE ANTIBODY SCREEN: Antibody Screen: NEGATIVE

## 2018-12-11 LAB — OB RESULTS CONSOLE HEPATITIS B SURFACE ANTIGEN: Hepatitis B Surface Ag: NEGATIVE

## 2018-12-11 LAB — OB RESULTS CONSOLE RUBELLA ANTIBODY, IGM: Rubella: IMMUNE

## 2019-02-01 ENCOUNTER — Inpatient Hospital Stay (HOSPITAL_BASED_OUTPATIENT_CLINIC_OR_DEPARTMENT_OTHER): Payer: 59

## 2019-02-01 ENCOUNTER — Inpatient Hospital Stay (HOSPITAL_COMMUNITY)
Admission: AD | Admit: 2019-02-01 | Discharge: 2019-02-01 | Disposition: A | Discharge status: Home / Self Care | Payer: 59 | Attending: Obstetrics and Gynecology | Admitting: Obstetrics and Gynecology

## 2019-02-01 ENCOUNTER — Encounter (HOSPITAL_COMMUNITY): Payer: Self-pay | Admitting: *Deleted

## 2019-02-01 DIAGNOSIS — R109 Unspecified abdominal pain: Secondary | ICD-10-CM

## 2019-02-01 DIAGNOSIS — O26892 Other specified pregnancy related conditions, second trimester: Secondary | ICD-10-CM

## 2019-02-01 DIAGNOSIS — R1030 Lower abdominal pain, unspecified: Secondary | ICD-10-CM | POA: Insufficient documentation

## 2019-02-01 DIAGNOSIS — Y92414 Local residential or business street as the place of occurrence of the external cause: Secondary | ICD-10-CM | POA: Insufficient documentation

## 2019-02-01 DIAGNOSIS — O9A212 Injury, poisoning and certain other consequences of external causes complicating pregnancy, second trimester: Secondary | ICD-10-CM

## 2019-02-01 DIAGNOSIS — Z3A17 17 weeks gestation of pregnancy: Secondary | ICD-10-CM

## 2019-02-01 LAB — URINALYSIS, ROUTINE W REFLEX MICROSCOPIC
Bilirubin Urine: NEGATIVE
Glucose, UA: NEGATIVE mg/dL
Hgb urine dipstick: NEGATIVE
Ketones, ur: NEGATIVE mg/dL
Leukocytes,Ua: NEGATIVE
Nitrite: NEGATIVE
Protein, ur: NEGATIVE mg/dL
Specific Gravity, Urine: 1.014 (ref 1.005–1.030)
pH: 9 — ABNORMAL HIGH (ref 5.0–8.0)

## 2019-02-01 MED ORDER — RHO D IMMUNE GLOBULIN 1500 UNIT/2ML IJ SOSY
300.0000 ug | PREFILLED_SYRINGE | Freq: Once | INTRAMUSCULAR | Status: AC
  Administered 2019-02-01: 300 ug via INTRAMUSCULAR
  Filled 2019-02-01: qty 2
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
## 2019-02-01 NOTE — ED Notes (Signed)
Urine in lab 
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
## 2019-02-02 LAB — RH IG WORKUP (INCLUDES ABO/RH)
ABO/RH(D): O NEG
Antibody Screen: NEGATIVE
Gestational Age(Wks): 17.2
Unit division: 0

## 2019-05-18 NOTE — L&D Delivery Note (Signed)
Operative Delivery Note At 6:14 PM a viable female was delivered via Vaginal, Vacuum Investment banker, operational).  Presentation: vertex; Position: Right,, Occiput,, Anterior; Station: +2. Progressed well but then arrested and became exhausted. D/w pt CS vs OVD. Elected OVD.   Verbal consent: obtained from patient.  Risks and benefits discussed in detail.  Risks include, but are not limited to the risks of anesthesia, bleeding, infection, damage to maternal tissues, fetal cephalhematoma.  There is also the risk of inability to effect vaginal delivery of the head, or shoulder dystocia that cannot be resolved by established maneuvers, leading to the need for emergency cesarean section.  APGAR: pending, ; weight pending Placenta status:pathology - meconium .   Cord:  with the following complications: .  Cord pH: not sent  Anesthesia:  CLE Instruments: kiwi Episiotomy:  None Lacerations:  2nd degree Suture Repair: 3.0 vicryl Est. Blood Loss (mL):  200cc   It's a girl - "Norma Weber"!!  Mom to postpartum.  Baby to Couplet care / Skin to Skin.  Ranae Pila 06/20/2019, 6:35 PM

## 2019-06-15 ENCOUNTER — Inpatient Hospital Stay (HOSPITAL_COMMUNITY)
Admission: AD | Admit: 2019-06-15 | Discharge: 2019-06-15 | Disposition: A | Discharge status: Home / Self Care | Payer: 59 | Attending: Obstetrics and Gynecology | Admitting: Obstetrics and Gynecology

## 2019-06-15 ENCOUNTER — Encounter (HOSPITAL_COMMUNITY): Payer: Self-pay | Admitting: Obstetrics and Gynecology

## 2019-06-15 DIAGNOSIS — O133 Gestational [pregnancy-induced] hypertension without significant proteinuria, third trimester: Secondary | ICD-10-CM | POA: Diagnosis not present

## 2019-06-15 DIAGNOSIS — Z3A36 36 weeks gestation of pregnancy: Secondary | ICD-10-CM

## 2019-06-15 DIAGNOSIS — Z3689 Encounter for other specified antenatal screening: Secondary | ICD-10-CM

## 2019-06-15 DIAGNOSIS — O368390 Maternal care for abnormalities of the fetal heart rate or rhythm, unspecified trimester, not applicable or unspecified: Secondary | ICD-10-CM

## 2019-06-15 DIAGNOSIS — O368330 Maternal care for abnormalities of the fetal heart rate or rhythm, third trimester, not applicable or unspecified: Secondary | ICD-10-CM | POA: Insufficient documentation

## 2019-06-15 DIAGNOSIS — R030 Elevated blood-pressure reading, without diagnosis of hypertension: Secondary | ICD-10-CM | POA: Diagnosis present

## 2019-06-15 LAB — URINALYSIS, ROUTINE W REFLEX MICROSCOPIC
Bilirubin Urine: NEGATIVE
Glucose, UA: NEGATIVE mg/dL
Hgb urine dipstick: NEGATIVE
Ketones, ur: NEGATIVE mg/dL
Nitrite: NEGATIVE
Protein, ur: NEGATIVE mg/dL
Specific Gravity, Urine: 1.008 (ref 1.005–1.030)
pH: 7 (ref 5.0–8.0)

## 2019-06-15 LAB — CBC
HCT: 39.7 % (ref 36.0–46.0)
Hemoglobin: 13.5 g/dL (ref 12.0–15.0)
MCH: 30 pg (ref 26.0–34.0)
MCHC: 34 g/dL (ref 30.0–36.0)
MCV: 88.2 fL (ref 80.0–100.0)
Platelets: 147 10*3/uL — ABNORMAL LOW (ref 150–400)
RBC: 4.5 MIL/uL (ref 3.87–5.11)
RDW: 13.8 % (ref 11.5–15.5)
WBC: 8.8 10*3/uL (ref 4.0–10.5)
nRBC: 0 % (ref 0.0–0.2)

## 2019-06-15 LAB — COMPREHENSIVE METABOLIC PANEL
ALT: 15 U/L (ref 0–44)
AST: 20 U/L (ref 15–41)
Albumin: 2.9 g/dL — ABNORMAL LOW (ref 3.5–5.0)
Alkaline Phosphatase: 123 U/L (ref 38–126)
Anion gap: 10 (ref 5–15)
BUN: 11 mg/dL (ref 6–20)
CO2: 21 mmol/L — ABNORMAL LOW (ref 22–32)
Calcium: 9.3 mg/dL (ref 8.9–10.3)
Chloride: 103 mmol/L (ref 98–111)
Creatinine, Ser: 0.67 mg/dL (ref 0.44–1.00)
GFR calc Af Amer: >60 mL/min (ref >60)
GFR calc non Af Amer: >60 mL/min (ref >60)
Glucose, Bld: 101 mg/dL — ABNORMAL HIGH (ref 70–99)
Potassium: 3.8 mmol/L (ref 3.5–5.1)
Sodium: 134 mmol/L — ABNORMAL LOW (ref 135–145)
Total Bilirubin: 0.4 mg/dL (ref 0.3–1.2)
Total Protein: 5.9 g/dL — ABNORMAL LOW (ref 6.5–8.1)

## 2019-06-15 LAB — PROTEIN / CREATININE RATIO, URINE
Creatinine, Urine: 40.67 mg/dL
Total Protein, Urine: <6 mg/dL

## 2019-06-15 NOTE — Progress Notes (Signed)
Blanche East, NP off unit @ time decel occurred, informed of decel once she returned to unit.

## 2019-06-15 NOTE — Progress Notes (Signed)
Prolonged decel noted to begin @ 1640.  Pt repositioned in both left & right lateral positions before gradually returning to baseline.

## 2019-06-15 NOTE — Discharge Instructions (Signed)

## 2019-06-15 NOTE — MAU Provider Note (Signed)
History     CSN: [id number]  Arrival date and time: 06/15/19 1334   First Provider Initiated Contact with Patient 06/15/19 1435      Chief Complaint  Patient presents with  . Hypertension   HPI   Norma Weber is a 29 y.o. female G1P0 @ [redacted]w[redacted]d here in MAU with complaints of elevated BP readings in the office today. She has no symptoms of scotoma, or blurred vision. She has had several elevated BP's in the office and at home over the last few weeks. States her BP's have not been severe range and usually wax and wane.   BP's at home are 140/90, which are consistent with the BP's in the office.  + FM   OB History    Gravida  1   Para      Term      Preterm      AB      Living        SAB      TAB      Ectopic      Multiple      Live Births              Past Medical History:  Diagnosis Date  . Anxiety   . Migraine     Past Surgical History:  Procedure Laterality Date  . HERNIA REPAIR      Family History  Problem Relation Age of Onset  . Depression Mother   . Arthritis Mother   . Migraines Mother   . Depression Father     Social History   Tobacco Use  . Smoking status: Never Smoker  . Smokeless tobacco: Never Used  Substance Use Topics  . Alcohol use: Not Currently  . Drug use: No    Allergies: No Known Allergies  Medications Prior to Admission  Medication Sig Dispense Refill Last Dose  . Prenatal Vit-Fe Fumarate-FA (PRENATAL MULTIVITAMIN) TABS tablet Take 1 tablet by mouth daily at 12 noon.   06/15/2019 at 080  . sertraline (ZOLOFT) 100 MG tablet Take 150 mg by mouth daily.   06/15/2019 at 0830  . ciclopirox (PENLAC) 8 % solution Apply topically at bedtime. Apply over nail and surrounding skin. Apply daily over previous coat. After seven (7) days, may remove with alcohol and continue cycle. (Patient not taking: Reported on 12/23/2017) 6.6 mL 11   . Efinaconazole (JUBLIA) 10 % SOLN Apply one drop or brush to affected area, remove every 7  days with alcohol, repeat. (Patient not taking: Reported on 12/23/2017) 1 Bottle 11   . escitalopram (LEXAPRO) 20 MG tablet Take 1 tablet (20 mg total) by mouth daily. 90 tablet 0   . linaclotide (LINZESS) 72 MCG capsule TAKE 1 CAPSULE BY MOUTH EVERY DAY     . nortriptyline (PAMELOR) 50 MG capsule Take 1 capsule (50 mg total) by mouth at bedtime.     . promethazine (PHENERGAN) 25 MG tablet Take 25 mg by mouth every 6 (six) hours as needed for nausea or vomiting.   More than a month at Unknown time   Results for orders placed or performed during the hospital encounter of 06/15/19 (from the past 48 hour(s))  Urinalysis, Routine w reflex microscopic     Status: Abnormal   Collection Time: 06/15/19  2:00 PM  Result Value Ref Range   Color, Urine YELLOW YELLOW   APPearance HAZY (A) CLEAR   Specific Gravity, Urine 1.008 1.005 - 1.030   pH 7.0 5.0 -  8.0   Glucose, UA NEGATIVE NEGATIVE mg/dL   Hgb urine dipstick NEGATIVE NEGATIVE   Bilirubin Urine NEGATIVE NEGATIVE   Ketones, ur NEGATIVE NEGATIVE mg/dL   Protein, ur NEGATIVE NEGATIVE mg/dL   Nitrite NEGATIVE NEGATIVE   Leukocytes,Ua TRACE (A) NEGATIVE   RBC / HPF 0-5 0 - 5 RBC/hpf   WBC, UA 0-5 0 - 5 WBC/hpf   Bacteria, UA MANY (A) NONE SEEN   Squamous Epithelial / LPF 0-5 0 - 5   Mucus PRESENT     Comment: Performed at [facility], [address], [place], [place] [address]  Protein / creatinine ratio, urine     Status: None   Collection Time: 06/15/19  2:00 PM  Result Value Ref Range   Creatinine, Urine 40.67 mg/dL   Total Protein, Urine <6 mg/dL   Protein Creatinine Ratio        0.00 - 0.15 mg/mg[Cre]    Comment: RESULT BELOW REPORTABLE RANGE, UNABLE TO CALCULATE. Performed at [facility], [address], [place], [place] [address]   CBC     Status: Abnormal   Collection Time: 06/15/19  3:17 PM  Result Value Ref Range   WBC 8.8 4.0 - 10.5 K/uL   RBC 4.50 3.87 - 5.11 MIL/uL   Hemoglobin 13.5 12.0 - 15.0 g/dL   HCT  35.3 61.4 - 43.1 %   MCV 88.2 80.0 - 100.0 fL   MCH 30.0 26.0 - 34.0 pg   MCHC 34.0 30.0 - 36.0 g/dL   RDW 54.0 08.6 - 76.1 %   Platelets 147 (L) 150 - 400 K/uL   nRBC 0.0 0.0 - 0.2 %    Comment: Performed at [facility], [address] [place], [place] [address]  Comprehensive metabolic panel     Status: Abnormal   Collection Time: 06/15/19  3:17 PM  Result Value Ref Range   Sodium 134 (L) 135 - 145 mmol/L   Potassium 3.8 3.5 - 5.1 mmol/L   Chloride 103 98 - 111 mmol/L   CO2 21 (L) 22 - 32 mmol/L   Glucose, Bld 101 (H) 70 - 99 mg/dL   BUN 11 6 - 20 mg/dL   Creatinine, Ser 2.67 0.44 - 1.00 mg/dL   Calcium 9.3 8.9 - 12.4 mg/dL   Total Protein 5.9 (L) 6.5 - 8.1 g/dL   Albumin 2.9 (L) 3.5 - 5.0 g/dL   AST 20 15 - 41 U/L   ALT 15 0 - 44 U/L   Alkaline Phosphatase 123 38 - 126 U/L   Total Bilirubin 0.4 0.3 - 1.2 mg/dL   GFR calc non Af Amer >60 >60 mL/min   GFR calc Af Amer >60 >60 mL/min   Anion gap 10 5 - 15    Comment: Performed at [facility], [address] [place], [place] [address]    Review of Systems  Constitutional: Negative for fever.  Eyes: Negative for photophobia and visual disturbance.  Gastrointestinal: Negative for abdominal pain.  Genitourinary: Negative for vaginal bleeding and vaginal discharge.  Neurological: Negative for headaches.   Physical Exam   Blood pressure 130/80, pulse 70, temperature 98.2 F (36.8 C), resp. rate 16, height 5\' 10"  (1.778 m), weight 92.5 kg, SpO2 100 %.   Patient Vitals for the past 24 hrs:  BP Temp Pulse Resp SpO2 Height Weight  06/15/19 1848 -- -- -- 18 -- -- --  06/15/19 1815 133/81 -- 89 -- -- -- --  06/15/19 1800 125/83 -- 82 -- -- -- --  06/15/19 1745 119/71 -- 80 -- -- -- --  06/15/19 1730 126/71 -- 76 -- -- -- --  06/15/19 1715 122/75 -- 74 -- -- -- --  06/15/19 1700 122/74 -- 74 -- -- -- --  06/15/19 1645 (!) 142/92 -- 69 -- -- -- --  06/15/19 1630 140/87 -- 68 -- -- -- --  06/15/19 1615 135/86 --  70 -- -- -- --  06/15/19 1545 117/74 -- 73 -- -- -- --  06/15/19 1530 131/81 -- 77 -- -- -- --  06/15/19 1515 136/90 -- 72 -- -- -- --  06/15/19 1500 129/82 -- 74 -- -- -- --  06/15/19 1445 133/86 -- 77 -- -- -- --  06/15/19 1430 130/80 -- 70 -- -- -- --  06/15/19 1415 132/81 -- 72 -- -- -- --  06/15/19 1349 (!) 148/101 98.2 F (36.8 C) -- 16 100 % -- --  06/15/19 1347 -- 98.7 F (37.1 C) 88 16 -- -- --  06/15/19 1346 -- -- -- -- -- 5\' 10"  (1.778 m) 92.5 kg    Physical Exam  Constitutional: She is oriented to person, place, and time. She appears well-developed and well-nourished. No distress.  HENT:  Head: Normocephalic.  Eyes: Pupils are equal, round, and reactive to light.  GI: Soft. She exhibits no distension. There is no abdominal tenderness. There is no rebound and no guarding.  Musculoskeletal:        General: Normal range of motion.  Neurological: She is alert and oriented to person, place, and time. GCS eye subscore is 4. GCS verbal subscore is 5. GCS motor subscore is 6.  Reflex Scores:      Patellar reflexes are 3+ on the right side and 3+ on the left side. Negative clonus   Skin: Skin is warm. She is not diaphoretic.  Psychiatric: Her behavior is normal.   Fetal Tracing: Baseline: 130 bpm Variability: moderate  Accelerations: 15x15 Decelerations:  3-4 min prolonged deceleration @ 1640, good return to baseline with 15x15 accels Toco: occasional, irregular pattern   MAU Course  Procedures  None  MDM  NST reactive.  [place] labs collected  Platelets 147 Spoke to Dr. Royston Sinner; Discussed platelet count, Dr. Royston Sinner was able to pull up recent labs in the office, which show platelets are consistent with what the patient has been in the office @ 150. She will f/u in the office on Monday as scheduled for BP check .  Assessment and Plan   A:  1. Gestational hypertension, third trimester   2. [redacted] weeks gestation of pregnancy   3. Variable fetal heart rate decelerations,  antepartum   4. NST (non-stress test) reactive     P:  Discharge home with strict return precautions Pre E precautions Keep your appointment in the office on Monday. She will need a BP check. Return to MAU with scotoma or HA Rest over the wkd Kick counts reviewed  Lezlie Lye, NP 06/15/2019 9:07 PM

## 2019-06-15 NOTE — MAU Note (Signed)
.   Norma Weber is a 29 y.o. at [redacted]w[redacted]d here in MAU reporting: she was sent from office for an increase in blood pressure. Pt states that she has had a couple of slight increases in her blood pressure, was sent today for labs. Lower abdominal pressure at times  Onset of complaint: ongoinng Pain score: 7 Vitals:   06/15/19 1347 06/15/19 1349  BP:  (!) 148/101  Pulse: 88   Resp: 16 16  Temp: 98.7 F (37.1 C) 98.2 F (36.8 C)  SpO2:  100%     FHT:145 Lab orders placed from triage: UA

## 2019-06-18 ENCOUNTER — Telehealth (HOSPITAL_COMMUNITY): Payer: Self-pay | Admitting: *Deleted

## 2019-06-18 ENCOUNTER — Encounter (HOSPITAL_COMMUNITY): Payer: Self-pay | Admitting: *Deleted

## 2019-06-18 NOTE — Telephone Encounter (Signed)
Preadmission screen  

## 2019-06-19 ENCOUNTER — Other Ambulatory Visit (HOSPITAL_COMMUNITY)
Admission: RE | Admit: 2019-06-19 | Discharge: 2019-06-19 | Disposition: A | Discharge status: Home / Self Care | Payer: 59 | Source: Ambulatory Visit | Attending: Obstetrics and Gynecology | Admitting: Obstetrics and Gynecology

## 2019-06-19 LAB — SARS CORONAVIRUS 2 (TAT 6-24 HRS): SARS Coronavirus 2: NEGATIVE

## 2019-06-20 ENCOUNTER — Encounter (HOSPITAL_COMMUNITY): Payer: Self-pay | Admitting: Obstetrics & Gynecology

## 2019-06-20 ENCOUNTER — Inpatient Hospital Stay (HOSPITAL_COMMUNITY): Payer: 59 | Admitting: Anesthesiology

## 2019-06-20 ENCOUNTER — Other Ambulatory Visit: Payer: Self-pay

## 2019-06-20 ENCOUNTER — Inpatient Hospital Stay (HOSPITAL_COMMUNITY)
Admission: AD | Admit: 2019-06-20 | Discharge: 2019-06-22 | DRG: 805 | Disposition: A | Discharge status: Home / Self Care | Payer: 59 | Attending: Obstetrics & Gynecology | Admitting: Obstetrics & Gynecology

## 2019-06-20 DIAGNOSIS — O411230 Chorioamnionitis, third trimester, not applicable or unspecified: Secondary | ICD-10-CM | POA: Diagnosis present

## 2019-06-20 DIAGNOSIS — Z3A37 37 weeks gestation of pregnancy: Secondary | ICD-10-CM | POA: Diagnosis not present

## 2019-06-20 DIAGNOSIS — O99344 Other mental disorders complicating childbirth: Secondary | ICD-10-CM | POA: Diagnosis present

## 2019-06-20 DIAGNOSIS — Z20822 Contact with and (suspected) exposure to covid-19: Secondary | ICD-10-CM | POA: Diagnosis present

## 2019-06-20 DIAGNOSIS — O479 False labor, unspecified: Secondary | ICD-10-CM

## 2019-06-20 DIAGNOSIS — F419 Anxiety disorder, unspecified: Secondary | ICD-10-CM | POA: Diagnosis present

## 2019-06-20 DIAGNOSIS — O133 Gestational [pregnancy-induced] hypertension without significant proteinuria, third trimester: Secondary | ICD-10-CM

## 2019-06-20 DIAGNOSIS — O134 Gestational [pregnancy-induced] hypertension without significant proteinuria, complicating childbirth: Secondary | ICD-10-CM | POA: Diagnosis present

## 2019-06-20 LAB — CBC
HCT: 39.7 % (ref 36.0–46.0)
Hemoglobin: 13.3 g/dL (ref 12.0–15.0)
MCH: 29.9 pg (ref 26.0–34.0)
MCHC: 33.5 g/dL (ref 30.0–36.0)
MCV: 89.2 fL (ref 80.0–100.0)
Platelets: 141 10*3/uL — ABNORMAL LOW (ref 150–400)
RBC: 4.45 MIL/uL (ref 3.87–5.11)
RDW: 14.1 % (ref 11.5–15.5)
WBC: 13.4 10*3/uL — ABNORMAL HIGH (ref 4.0–10.5)
nRBC: 0 % (ref 0.0–0.2)

## 2019-06-20 LAB — RPR: RPR Ser Ql: NONREACTIVE

## 2019-06-20 MED ORDER — TERBUTALINE SULFATE 1 MG/ML IJ SOLN: 0.2500 mg | Freq: Once | INTRAMUSCULAR | Status: DC | PRN

## 2019-06-20 MED ORDER — OXYCODONE-ACETAMINOPHEN 5-325 MG PO TABS: 2.0000 | ORAL_TABLET | ORAL | Status: DC | PRN

## 2019-06-20 MED ORDER — OXYCODONE-ACETAMINOPHEN 5-325 MG PO TABS: 1.0000 | ORAL_TABLET | ORAL | Status: DC | PRN

## 2019-06-20 MED ORDER — COCONUT OIL OIL: 1.0000 "application " | TOPICAL_OIL | Status: DC | PRN

## 2019-06-20 MED ORDER — SENNOSIDES-DOCUSATE SODIUM 8.6-50 MG PO TABS
2.0000 | ORAL_TABLET | ORAL | Status: DC
  Administered 2019-06-21 (×2): 2 via ORAL
  Filled 2019-06-20 (×2): qty 2

## 2019-06-20 MED ORDER — FLEET ENEMA 7-19 GM/118ML RE ENEM: 1.0000 | ENEMA | RECTAL | Status: DC | PRN

## 2019-06-20 MED ORDER — LIDOCAINE HCL (PF) 1 % IJ SOLN
INTRAMUSCULAR | Status: DC | PRN
  Administered 2019-06-20: 4 mL via EPIDURAL
  Administered 2019-06-20: 5 mL via EPIDURAL

## 2019-06-20 MED ORDER — WITCH HAZEL-GLYCERIN EX PADS: 1.0000 "application " | MEDICATED_PAD | CUTANEOUS | Status: DC | PRN

## 2019-06-20 MED ORDER — SIMETHICONE 80 MG PO CHEW: 80.0000 mg | CHEWABLE_TABLET | ORAL | Status: DC | PRN

## 2019-06-20 MED ORDER — PHENYLEPHRINE 40 MCG/ML (10ML) SYRINGE FOR IV PUSH (FOR BLOOD PRESSURE SUPPORT): 80.0000 ug | PREFILLED_SYRINGE | INTRAVENOUS | Status: DC | PRN

## 2019-06-20 MED ORDER — EPHEDRINE 5 MG/ML INJ: 10.0000 mg | INTRAVENOUS | Status: DC | PRN

## 2019-06-20 MED ORDER — ONDANSETRON HCL 4 MG/2ML IJ SOLN: 4.0000 mg | INTRAMUSCULAR | Status: DC | PRN

## 2019-06-20 MED ORDER — LACTATED RINGERS IV SOLN: INTRAVENOUS | Status: DC

## 2019-06-20 MED ORDER — DIBUCAINE (PERIANAL) 1 % EX OINT: 1.0000 "application " | TOPICAL_OINTMENT | CUTANEOUS | Status: DC | PRN

## 2019-06-20 MED ORDER — BUTORPHANOL TARTRATE 1 MG/ML IJ SOLN
INTRAMUSCULAR | Status: AC
  Administered 2019-06-20: 1 mg
  Filled 2019-06-20: qty 1

## 2019-06-20 MED ORDER — SERTRALINE HCL 50 MG PO TABS
150.0000 mg | ORAL_TABLET | Freq: Every day | ORAL | Status: DC
  Administered 2019-06-21 – 2019-06-22 (×2): 150 mg via ORAL
  Filled 2019-06-20 (×2): qty 1

## 2019-06-20 MED ORDER — ACETAMINOPHEN 325 MG PO TABS: 650.0000 mg | ORAL_TABLET | ORAL | Status: DC | PRN

## 2019-06-20 MED ORDER — SODIUM CHLORIDE 0.9 % IV SOLN
2.0000 g | Freq: Once | INTRAVENOUS | Status: AC
  Administered 2019-06-20: 2 g via INTRAVENOUS
  Filled 2019-06-20: qty 2000

## 2019-06-20 MED ORDER — OXYTOCIN 40 UNITS IN NORMAL SALINE INFUSION - SIMPLE MED
2.5000 [IU]/h | INTRAVENOUS | Status: DC
  Filled 2019-06-20: qty 1000

## 2019-06-20 MED ORDER — IBUPROFEN 600 MG PO TABS
600.0000 mg | ORAL_TABLET | Freq: Four times a day (QID) | ORAL | Status: DC
  Administered 2019-06-21 – 2019-06-22 (×8): 600 mg via ORAL
  Filled 2019-06-20 (×8): qty 1

## 2019-06-20 MED ORDER — DIPHENHYDRAMINE HCL 25 MG PO CAPS: 25.0000 mg | ORAL_CAPSULE | Freq: Four times a day (QID) | ORAL | Status: DC | PRN

## 2019-06-20 MED ORDER — ONDANSETRON HCL 4 MG/2ML IJ SOLN: 4.0000 mg | Freq: Four times a day (QID) | INTRAMUSCULAR | Status: DC | PRN

## 2019-06-20 MED ORDER — TETANUS-DIPHTH-ACELL PERTUSSIS 5-2.5-18.5 LF-MCG/0.5 IM SUSP: 0.5000 mL | Freq: Once | INTRAMUSCULAR | Status: DC

## 2019-06-20 MED ORDER — BENZOCAINE-MENTHOL 20-0.5 % EX AERO
1.0000 "application " | INHALATION_SPRAY | CUTANEOUS | Status: DC | PRN
  Administered 2019-06-21: 1 via TOPICAL
  Filled 2019-06-20 (×2): qty 56

## 2019-06-20 MED ORDER — OXYTOCIN 40 UNITS IN NORMAL SALINE INFUSION - SIMPLE MED
1.0000 m[IU]/min | INTRAVENOUS | Status: DC
  Administered 2019-06-20: 2 m[IU]/min via INTRAVENOUS

## 2019-06-20 MED ORDER — BUTORPHANOL TARTRATE 1 MG/ML IJ SOLN: 1.0000 mg | Freq: Once | INTRAMUSCULAR | Status: DC

## 2019-06-20 MED ORDER — DIPHENHYDRAMINE HCL 50 MG/ML IJ SOLN: 12.5000 mg | INTRAMUSCULAR | Status: DC | PRN

## 2019-06-20 MED ORDER — GENTAMICIN SULFATE 40 MG/ML IJ SOLN
5.0000 mg/kg | Freq: Once | INTRAVENOUS | Status: AC
  Administered 2019-06-20: 390 mg via INTRAVENOUS
  Filled 2019-06-20: qty 9.75

## 2019-06-20 MED ORDER — LACTATED RINGERS IV SOLN: 500.0000 mL | INTRAVENOUS | Status: DC | PRN

## 2019-06-20 MED ORDER — LIDOCAINE HCL (PF) 1 % IJ SOLN: 30.0000 mL | INTRAMUSCULAR | Status: DC | PRN

## 2019-06-20 MED ORDER — ZOLPIDEM TARTRATE 5 MG PO TABS: 5.0000 mg | ORAL_TABLET | Freq: Every evening | ORAL | Status: DC | PRN

## 2019-06-20 MED ORDER — OXYCODONE HCL 5 MG PO TABS: 5.0000 mg | ORAL_TABLET | ORAL | Status: DC | PRN

## 2019-06-20 MED ORDER — PRENATAL MULTIVITAMIN CH
1.0000 | ORAL_TABLET | Freq: Every day | ORAL | Status: DC
  Administered 2019-06-21 – 2019-06-22 (×2): 1 via ORAL
  Filled 2019-06-20 (×2): qty 1

## 2019-06-20 MED ORDER — LACTATED RINGERS IV SOLN
500.0000 mL | Freq: Once | INTRAVENOUS | Status: AC
  Administered 2019-06-20: 500 mL via INTRAVENOUS

## 2019-06-20 MED ORDER — ONDANSETRON HCL 4 MG PO TABS: 4.0000 mg | ORAL_TABLET | ORAL | Status: DC | PRN

## 2019-06-20 MED ORDER — SOD CITRATE-CITRIC ACID 500-334 MG/5ML PO SOLN: 30.0000 mL | ORAL | Status: DC | PRN

## 2019-06-20 MED ORDER — OXYCODONE HCL 5 MG PO TABS: 10.0000 mg | ORAL_TABLET | ORAL | Status: DC | PRN

## 2019-06-20 MED ORDER — FENTANYL-BUPIVACAINE-NACL 0.5-0.125-0.9 MG/250ML-% EP SOLN
12.0000 mL/h | EPIDURAL | Status: DC | PRN
  Filled 2019-06-20: qty 250

## 2019-06-20 MED ORDER — OXYTOCIN BOLUS FROM INFUSION
500.0000 mL | Freq: Once | INTRAVENOUS | Status: AC
  Administered 2019-06-20: 18:00:00 500 mL via INTRAVENOUS

## 2019-06-20 MED ORDER — SODIUM CHLORIDE (PF) 0.9 % IJ SOLN
INTRAMUSCULAR | Status: DC | PRN
  Administered 2019-06-20: 12 mL/h via EPIDURAL

## 2019-06-20 NOTE — Progress Notes (Signed)
Pushing with good effort.  Making good progress.

## 2019-06-20 NOTE — Progress Notes (Signed)
C/c/+2 Temp 100.5 Recommend start amp/gent for presumed chorioamnionitis Start to push

## 2019-06-20 NOTE — Lactation Note (Signed)
This note was copied from a baby's chart. Lactation Consultation Note  Patient Name: Norma Weber [id number] Date: 06/20/2019 Reason for consult: Initial assessment;Primapara;1st time breastfeeding  2200 - 2218 - I conducted an initial consult with [name] a P1 with 3 hour old daughter, Norma Weber. She asked for some help with latch, and I assisted with placement of Norma Weber on the left breast in cross cradle hold. I showed [name] how to compress areola for "breast sandwich" to assist in baby latching to short nipple. Norma Weber opened up and latched but would quickly slide off the breast. She would then root, re-latch and repeat the cycle.  I showed [name] and her support person how to hold breast to help abby maintain her latch, and I had her support person repeat back.  We allowed baby to lick and learn and then placed her on [name]'s chest.  [name] reports exhaustion and a desire to rest. She has a DEBP set up at the bedside from her RN, and I recommended that she pump occasionally for stimulation purposes.  I educated on day 1 infant feeding patterns, feeding cues, and recommended breast feeding 8-12 times a day on demand.  I wrote my name on her board and recommended that she call lactation as needed for assistance. All questions answered at this time.   Maternal Data Formula Feeding for Exclusion: No Does the patient have breastfeeding experience prior to this delivery?: No  Feeding Feeding Type: Breast Fed  LATCH Score Latch: Repeated attempts needed to sustain latch, nipple held in mouth throughout feeding, stimulation needed to elicit sucking reflex.  Audible Swallowing: None  Type of Nipple: Flat  Comfort (Breast/Nipple): Soft / non-tender  Hold (Positioning): Assistance needed to correctly position infant at breast and maintain latch.  LATCH Score: 5  Interventions Interventions: Breast feeding basics reviewed;Assisted with latch;Skin to skin;Breast  massage;Breast compression;Support pillows;Adjust position;DEBP  Lactation Tools Discussed/Used Pump Review: Setup, frequency, and cleaning   Consult Status Consult Status: Follow-up Date: 06/21/19 Follow-up type: In-patient    Walker Shadow 06/20/2019, 10:38 PM

## 2019-06-20 NOTE — MAU Provider Note (Signed)
Chief Complaint:  Contractions   First Provider Initiated Contact with Patient 06/20/19 [phone]     HPI: Norma Weber is a 29 y.o. G1P0 at 80w1dwho presents to maternity admissions reporting painful uterine contractions.  Was undergoing a RN labor evaluation, when BPs noted to be elevated.  Recently seen for same and has an Induction scheduled for tonight.  Has some increased dizziness today.  No headache, but doesn't feel well.  . She reports good fetal movement, denies LOF, vaginal bleeding, vaginal itching/burning, urinary symptoms, h/a, dizziness, n/v, diarrhea, constipation or fever/chills.  She denies headache, visual changes or RUQ abdominal pain.  Hypertension This is a recurrent problem. The current episode started in the past 7 days. The problem is unchanged. Associated symptoms include malaise/fatigue and peripheral edema. Pertinent negatives include no anxiety, blurred vision, chest pain, headaches (has dizziness), palpitations or shortness of breath. There are no associated agents to hypertension. There are no known risk factors for coronary artery disease. Past treatments include nothing. There are no compliance problems.   Abdominal Pain This is a new problem. The current episode started today. The onset quality is gradual. The problem occurs intermittently. The pain is moderate. The quality of the pain is cramping. The abdominal pain does not radiate. Pertinent negatives include no headaches (has dizziness). Nothing aggravates the pain. The pain is relieved by nothing. She has tried nothing for the symptoms.   Past Medical History: Past Medical History:  Diagnosis Date  . Anxiety   . GERD (gastroesophageal reflux disease)   . History of kidney stones   . Migraine     Past obstetric history: OB History  Gravida Para Term Preterm AB Living  1            SAB TAB Ectopic Multiple Live Births               # Outcome Date GA Lbr Len/2nd Weight Sex Delivery Anes PTL Lv  1  Current             Past Surgical History: Past Surgical History:  Procedure Laterality Date  . HERNIA REPAIR      Family History: Family History  Problem Relation Age of Onset  . Depression Mother   . Arthritis Mother   . Migraines Mother   . Depression Father     Social History: Social History   Tobacco Use  . Smoking status: Never Smoker  . Smokeless tobacco: Never Used  Substance Use Topics  . Alcohol use: Not Currently  . Drug use: No    Allergies: No Known Allergies  Meds:  Medications Prior to Admission  Medication Sig Dispense Refill Last Dose  . famotidine (PEPCID) 20 MG tablet Take 20 mg by mouth 2 (two) times daily.   06/19/2019 at Unknown time  . escitalopram (LEXAPRO) 20 MG tablet Take 1 tablet (20 mg total) by mouth daily. 90 tablet 0   . Prenatal Vit-Fe Fumarate-FA (PRENATAL MULTIVITAMIN) TABS tablet Take 1 tablet by mouth daily at 12 noon.     . promethazine (PHENERGAN) 25 MG tablet Take 25 mg by mouth every 6 (six) hours as needed for nausea or vomiting.     . sertraline (ZOLOFT) 100 MG tablet Take 150 mg by mouth daily.       I have reviewed patient's Past Medical Hx, Surgical Hx, Family Hx, Social Hx, medications and allergies.   ROS:  Review of Systems  Constitutional: Positive for malaise/fatigue.  Eyes: Negative for blurred vision.  Respiratory: Negative for shortness of breath.   Cardiovascular: Negative for chest pain and palpitations.  Gastrointestinal: Positive for abdominal pain.  Neurological: Negative for headaches (has dizziness).   Other systems negative  Physical Exam   Patient Vitals for the past 24 hrs:  BP Temp Temp src Pulse Resp Weight  06/20/19 0545 (!) 128/93 -- -- 78 -- --  06/20/19 0526 (!) 137/92 98.9 F (37.2 C) Oral 79 19 --  06/20/19 0520 -- -- -- -- -- 92.1 kg   Constitutional: Well-developed, well-nourished female in no acute distress.  Cardiovascular: normal rate and rhythm Respiratory: normal effort, clear  to auscultation bilaterally GI: Abd soft, non-tender, gravid appropriate for gestational age.   No rebound or guarding. MS: Extremities nontender, Trace edema, normal ROM Neurologic: Alert and oriented x 4. DTRs brisk 3+ with 1 beat clonus GU: Neg CVAT.  PELVIC EXAM:  Dilation: 3.5 Effacement (%): 90 Station: -2 Presentation: Vertex Exam by:: Latricia Heft, RN  FHT:  Baseline 145 , moderate variability, accelerations present, no decelerations Contractions: q 4-5 mins Irregular     Labs: Results for orders placed or performed during the hospital encounter of 06/19/19 (from the past 24 hour(s))  SARS CORONAVIRUS 2 (TAT 6-24 HRS) Nasopharyngeal Nasopharyngeal Swab     Status: None   Collection Time: 06/19/19 10:01 AM   Specimen: Nasopharyngeal Swab  Result Value Ref Range   SARS Coronavirus 2 NEGATIVE NEGATIVE   --/--/O NEG (09/17 2020)  Imaging:  No results found.  MAU Course/MDM: I have not ordered labs but reviewed results from last visit. NST reviewed, reassuring Consult Dr Langston Masker by RN  with presentation, exam findings and test results. She recommends admission Treatments in MAU included EFM.    Assessment: Single IUP at [redacted]w[redacted]d Gestational Hypertension New dizziness Latent Labor  Plan: Admit to Labor and delivery Routine orders MD to follow (Dr Langston Masker)  Wynelle Bourgeois CNM, MSN Certified Nurse-Midwife 06/20/2019 5:59 AM

## 2019-06-20 NOTE — Progress Notes (Signed)
9/c/+1, LOA

## 2019-06-20 NOTE — MAU Note (Addendum)
Contractions every 3-7 minutes.  Denies LOF.  Some bloody show.  States she was 4 cm/90% on last exam

## 2019-06-20 NOTE — Anesthesia Procedure Notes (Signed)
Epidural Patient location during procedure: OB Start time: 06/20/2019 8:49 AM End time: 06/20/2019 8:52 AM  Staffing Anesthesiologist: Beryle Lathe, MD Performed: anesthesiologist   Preanesthetic Checklist Completed: patient identified, IV checked, risks and benefits discussed, monitors and equipment checked, pre-op evaluation and timeout performed  Epidural Patient position: sitting Prep: DuraPrep Patient monitoring: continuous pulse ox and blood pressure Approach: midline Location: L3-L4 Injection technique: LOR saline  Needle:  Needle type: Tuohy  Needle gauge: 17 G Needle length: 9 cm Needle insertion depth: 5 cm Catheter size: 19 Gauge Catheter at skin depth: 10 cm Test dose: negative and Other (1% lidocaine)  Assessment Events: blood not aspirated  Additional Notes Patient identified. Risks including, but not limited to, bleeding, infection, nerve damage, paralysis, inadequate analgesia, blood pressure changes, nausea, vomiting, allergic reaction, postpartum back pain, itching, and headache were discussed. Patient expressed understanding and wished to proceed. Sterile prep and drape, including hand hygiene, mask, and sterile gloves were used. The patient was positioned and the spine was prepped. The skin was anesthetized with lidocaine. No paraesthesia or other complication noted. The patient did not experience any signs of intravascular injection such as tinnitus or metallic taste in mouth, nor signs of intrathecal spread such as rapid motor block. Please see nursing notes for vital signs. The patient tolerated the procedure well.   Leslye Peer, MDReason for block:procedure for pain

## 2019-06-20 NOTE — Lactation Note (Signed)
This note was copied from a baby's chart. Lactation Consultation Note Baby 5 hrs old. LC attempted to see mom. Everyone sleeping.  Patient Name: Norma Weber [id number] Date: 06/20/2019 Reason for consult: Initial assessment;Primapara;1st time breastfeeding   Maternal Data Formula Feeding for Exclusion: No Does the patient have breastfeeding experience prior to this delivery?: No  Feeding Feeding Type: Breast Fed  LATCH Score Latch: Repeated attempts needed to sustain latch, nipple held in mouth throughout feeding, stimulation needed to elicit sucking reflex.  Audible Swallowing: None  Type of Nipple: Flat  Comfort (Breast/Nipple): Soft / non-tender  Hold (Positioning): Assistance needed to correctly position infant at breast and maintain latch.  LATCH Score: 5  Interventions Interventions: Breast feeding basics reviewed;Assisted with latch;Skin to skin;Breast massage;Breast compression;Support pillows;Adjust position;DEBP  Lactation Tools Discussed/Used Pump Review: Setup, frequency, and cleaning   Consult Status Consult Status: Follow-up Date: 06/21/19 Follow-up type: In-patient    Charyl Dancer 06/20/2019, 11:52 PM

## 2019-06-20 NOTE — Anesthesia Preprocedure Evaluation (Signed)
Anesthesia Evaluation  Patient identified by MRN, date of birth, ID band Patient awake    Reviewed: Allergy & Precautions, NPO status , Patient's Chart, lab work & pertinent test results  History of Anesthesia Complications Negative for: history of anesthetic complications  Airway Mallampati: II   Neck ROM: Full    Dental   Pulmonary neg pulmonary ROS,    Pulmonary exam normal        Cardiovascular negative cardio ROS Normal cardiovascular exam     Neuro/Psych  Headaches, PSYCHIATRIC DISORDERS Anxiety    GI/Hepatic Neg liver ROS, GERD  ,  Endo/Other  negative endocrine ROS  Renal/GU negative Renal ROS     Musculoskeletal negative musculoskeletal ROS (+)   Abdominal   Peds  Hematology  Thrombocytopenia (Plt 141k)    Anesthesia Other Findings Covid neg 2/2  Reproductive/Obstetrics (+) Pregnancy                             Anesthesia Physical Anesthesia Plan  ASA: II  Anesthesia Plan: Epidural   Post-op Pain Management:    Induction:   PONV Risk Score and Plan: 2 and Treatment may vary due to age or medical condition  Airway Management Planned: Natural Airway  Additional Equipment: None  Intra-op Plan:   Post-operative Plan:   Informed Consent: I have reviewed the patients History and Physical, chart, labs and discussed the procedure including the risks, benefits and alternatives for the proposed anesthesia with the patient or authorized representative who has indicated his/her understanding and acceptance.       Plan Discussed with: Anesthesiologist  Anesthesia Plan Comments: (Labs reviewed. Platelets acceptable, patient not taking any blood thinning medications. Per RN, FHR tracing reported to be stable enough for sitting procedure. Risks and benefits discussed with patient, including PDPH, backache, epidural hematoma, failed epidural, blood pressure changes, allergic  reaction, and nerve injury. Patient expressed understanding and wished to proceed.)        Anesthesia Quick Evaluation

## 2019-06-20 NOTE — H&P (Addendum)
Norma Weber is a 29 y.o. female presenting for labor and gHTN. Pregnancy c/b anxiety and gHTN with planned IOL tonight. Came in with contractions. Denies SOB, CP, HA and change of vision. Reports some dizziness with ctxn.    OB History    Gravida  1   Para      Term      Preterm      AB      Living        SAB      TAB      Ectopic      Multiple      Live Births             Past Medical History:  Diagnosis Date  . Anxiety   . GERD (gastroesophageal reflux disease)   . History of kidney stones   . Migraine    Past Surgical History:  Procedure Laterality Date  . HERNIA REPAIR     Family History: family history includes Arthritis in her mother; Depression in her father and mother; Migraines in her mother. Social History:  reports that she has never smoked. She has never used smokeless tobacco. She reports previous alcohol use. She reports that she does not use drugs.     Maternal Diabetes: No Genetic Screening: Normal Maternal Ultrasounds/Referrals: Normal Fetal Ultrasounds or other Referrals:  None Maternal Substance Abuse:  No Significant Maternal Medications:  None Significant Maternal Lab Results:  None Other Comments:  None  Review of Systems History Dilation: 5 Effacement (%): 90 Station: -1 Exam by:: Alecea Trego Blood pressure 133/90, pulse 82, temperature 98.9 F (37.2 C), temperature source Oral, resp. rate 19, height 5\' 10"  (1.778 m), weight 92.1 kg. Exam Physical Exam  NAD, A&O NWOB Abd soft, nondistended, gravid SVE 5/90/-1, AROM meconium, OP  Prenatal labs: ABO, Rh: --/--/O NEG (09/17 2020) Antibody: NEG (09/17 2020) Rubella: Immune (07/27 0000) RPR: Nonreactive (07/27 0000)  HBsAg: Negative (07/27 0000)  HIV: Non-reactive (07/27 0000)  GBS:     Assessment/Plan: 29 yo G1P0 @ 37.1 wga presenting with labor and gHTN w/out severe features.  AROM for meconium  CLE now Peanut ball/position change after CLE.  Expecting a girl GBS  negative   26 06/20/2019, 8:32 AM

## 2019-06-21 ENCOUNTER — Inpatient Hospital Stay (HOSPITAL_COMMUNITY): Payer: 59

## 2019-06-21 ENCOUNTER — Inpatient Hospital Stay (HOSPITAL_COMMUNITY): Admission: AD | Admit: 2019-06-21 | Payer: 59 | Source: Home / Self Care | Admitting: Obstetrics and Gynecology

## 2019-06-21 LAB — CBC
HCT: 32.5 % — ABNORMAL LOW (ref 36.0–46.0)
Hemoglobin: 10.8 g/dL — ABNORMAL LOW (ref 12.0–15.0)
MCH: 29.8 pg (ref 26.0–34.0)
MCHC: 33.2 g/dL (ref 30.0–36.0)
MCV: 89.8 fL (ref 80.0–100.0)
Platelets: 125 10*3/uL — ABNORMAL LOW (ref 150–400)
RBC: 3.62 MIL/uL — ABNORMAL LOW (ref 3.87–5.11)
RDW: 14.1 % (ref 11.5–15.5)
WBC: 13.8 10*3/uL — ABNORMAL HIGH (ref 4.0–10.5)
nRBC: 0 % (ref 0.0–0.2)

## 2019-06-21 MED ORDER — RHO D IMMUNE GLOBULIN 1500 UNIT/2ML IJ SOSY
300.0000 ug | PREFILLED_SYRINGE | Freq: Once | INTRAMUSCULAR | Status: AC
  Administered 2019-06-21: 300 ug via INTRAVENOUS
  Filled 2019-06-21: qty 2

## 2019-06-21 NOTE — Lactation Note (Signed)
This note was copied from a baby's chart. Lactation Consultation Note  Patient Name: Norma Weber [id number] Date: 06/21/2019 Reason for consult: Follow-up assessment;Early term 37-38.6wks;Difficult latch;Primapara  LC student in for follow-up visit with Baby Norma "Norma Weber". Pecola Leisure is now 40 hours old. Baby is down 2% from birth. 3-Wet and 1-Stool.   MOB voiced difficulty overnight latching, but shares that last feed at 1000 was an improvement and baby seemed to maintain latch for ~ 20 minutes on one side. Asked MOB if she is seeing colostrum with feeding and stated she has seen some drops noted.    MOB requesting HE and use of DEBP demonstration while in room. HE demonstrated on side and MOB able to return demonstrate on the other side. Drops of colostrum noted. DEBP already set-up, LC student reinforced programing,  frequency, cleaning, and parts included.   Feeding plan discussed. Discussed feeding behavior of early term newborn. Requested that family call LC or RN when next feed to ensure proper latch with shield and to visualize transfer of milk.   Family denies any further questions or concerns at this time. Will need close follow-up.    Maternal Data Formula Feeding for Exclusion: No Has patient been taught Hand Expression?: Yes Does the patient have breastfeeding experience prior to this delivery?: No  Feeding Feeding Type: Breast Fed  LATCH Score                   Interventions Interventions: Breast feeding basics reviewed;Breast massage;Hand express;Expressed milk;Shells;DEBP  Lactation Tools Discussed/Used Pump Review: Setup, frequency, and cleaning Initiated by:: N.Forlan- LC Student  Date initiated:: 06/21/19   Consult Status Consult Status: Follow-up Date: 06/21/19 Follow-up type: In-patient   [facility] Student   Norma Weber 06/21/2019, 12:17 PM

## 2019-06-21 NOTE — Progress Notes (Signed)
Post Partum Day 1 Subjective: no complaints, up ad lib, voiding and tolerating PO  Objective: Blood pressure 121/81, pulse 74, temperature 97.6 F (36.4 C), temperature source Oral, resp. rate 18, height 5\' 10"  (1.778 m), weight 92.1 kg, SpO2 99 %, unknown if currently breastfeeding.  Physical Exam:  General: alert, cooperative, appears stated age and no distress Lochia: appropriate Uterine Fundus: firm Incision: healing well DVT Evaluation: No evidence of DVT seen on physical exam.  Recent Labs    06/20/19 0739 06/21/19 0547  HGB 13.3 10.8*  HCT 39.7 32.5*    Assessment/Plan: Plan for discharge tomorrow and Breastfeeding   LOS: 1 day   08/19/19 06/21/2019, 8:29 AM

## 2019-06-21 NOTE — Lactation Note (Signed)
This note was copied from a baby's chart. Lactation Consultation Note Baby 8 hrs old. Mom has semi flat nipples. Mom called d/t not able to latch baby. Mom in laid back position. Baby unable to obtain latch. NS #20 fitted latched baby. Mom felt some pinching. Lips flared, chin tug. Felt better. Colostrum noted in NS. Baby BF well. Baby clamping. Fitted #24 NS. Mom stated better. After feeding no colostrum noted in #24 NS, baby didn't BF as long as did on #20 NS. Shells given to mom. Encouraged to wear in am w/bra.  Mom has DEBP at bedside. Encouraged to pump tomorrow q3 hrs. Discussed positioning, support, breast massage, "C" hold, newborn behavior and feeding habits. Mom demonstrated application of NS. Mom states she feels better now that baby is able to BF well. Encouraged to call for assistance or questions.   Patient Name: Norma Weber [id number] Date: 06/21/2019 Reason for consult: Mother's request;Difficult latch;Primapara;Early term 20-38.6wks   Maternal Data Has patient been taught Hand Expression?: Yes  Feeding Feeding Type: Breast Fed  LATCH Score Latch: Repeated attempts needed to sustain latch, nipple held in mouth throughout feeding, stimulation needed to elicit sucking reflex.  Audible Swallowing: A few with stimulation  Type of Nipple: Flat  Comfort (Breast/Nipple): Soft / non-tender  Hold (Positioning): Full assist, staff holds infant at breast  LATCH Score: 5  Interventions Interventions: Breast feeding basics reviewed;Support pillows;Assisted with latch;Position options;Skin to skin;Breast massage;Shells;Hand express;Breast compression;Adjust position  Lactation Tools Discussed/Used Tools: Shells;Pump;Nipple Shields Nipple shield size: 24;20 Shell Type: Inverted Breast pump type: Double-Electric Breast Pump   Consult Status Consult Status: Follow-up Date: 06/21/19(in pm) Follow-up type: In-patient    Charyl Dancer 06/21/2019, 2:34 AM

## 2019-06-21 NOTE — Progress Notes (Signed)
MOB was referred for history of depression/anxiety.  * Referral screened out by Clinical Social Worker because none of the following criteria appear to apply:  ~ History of anxiety/depression during this pregnancy, or of post-partum depression following prior delivery. ~ Diagnosis of anxiety and/or depression within last 3 years OR * MOB's symptoms currently being treated with medication and/or therapy. Per [facility] records, MOB currently seeing a counselor. MOB also taking Zoloft.   Please contact the Clinical Social Worker if needs arise, by [facility] request, or if MOB scores greater than 9/yes to question 10 on Edinburgh Postpartum Depression Screen.  Lear Ng, LCSW [facility] [phone]

## 2019-06-21 NOTE — Anesthesia Postprocedure Evaluation (Signed)
Anesthesia Post Note  Patient: Norma Weber  Procedure(s) Performed: AN AD HOC LABOR EPIDURAL     Patient location during evaluation: Mother Baby Anesthesia Type: Epidural Level of consciousness: awake Pain management: satisfactory to patient Vital Signs Assessment: post-procedure vital signs reviewed and stable Respiratory status: spontaneous breathing Cardiovascular status: stable Anesthetic complications: no    Last Vitals:  Vitals:   06/21/19 0533 06/21/19 1002  BP: 121/81 129/89  Pulse: 74 93  Resp: 18   Temp: 36.4 C 36.6 C  SpO2: 99%     Last Pain:  Vitals:   06/21/19 1002  TempSrc: Oral  PainSc:    Pain Goal:                   [employer]

## 2019-06-22 LAB — RH IG WORKUP (INCLUDES ABO/RH)
ABO/RH(D): O NEG
Fetal Screen: NEGATIVE
Gestational Age(Wks): 37
Unit division: 0

## 2019-06-22 NOTE — Discharge Summary (Signed)
Obstetric Discharge Summary Reason for Admission: onset of labor Prenatal Procedures: none Intrapartum Procedures: vacuum Postpartum Procedures: none Complications-Operative and Postpartum: 2 degree perineal laceration Hemoglobin  Date Value Ref Range Status  06/21/2019 10.8 (L) 12.0 - 15.0 g/dL Final  12/52/7129 29.0 11.1 - 15.9 g/dL Final   HCT  Date Value Ref Range Status  06/21/2019 32.5 (L) 36.0 - 46.0 % Final   Hematocrit  Date Value Ref Range Status  12/23/2017 42.7 34.0 - 46.6 % Final    Physical Exam:  General: alert, cooperative, appears stated age and no distress Lochia: appropriate Uterine Fundus: firm Incision: healing well DVT Evaluation: No evidence of DVT seen on physical exam.  Discharge Diagnoses: Term Pregnancy-delivered  Discharge Information: Date: 06/22/2019 Activity: pelvic rest Diet: routine Medications: PNV and Ibuprofen Condition: stable Instructions: refer to practice specific booklet Discharge to: home   Newborn Data: Live born female  Birth Weight: 6 lb 11.8 oz (3056 g) APGAR: 2, 8  Newborn Delivery   Birth date/time: 06/20/2021 18:14:00 Delivery type: Vaginal, Vacuum (Extractor)      Home with mother.  Turner Daniels 06/22/2019, 9:35 AM

## 2019-06-22 NOTE — Lactation Note (Signed)
This note was copied from a baby's chart. Lactation Consultation Note  Patient Name: Girl Suha Schoenbeck [id number] Date: 06/22/2019 Reason for consult: Follow-up assessment;Early term 9-38.6wks Mom called out for latch assist.  Baby has been sleeping since last formula feeding.  Waking techniques done and assisted positioning baby in football hold on right side.  Baby opened wide and latched easily without nipple shield.  Observed good feeding for 15 minutes.  Recommended to supplement with formula.  Encouraged pumping.  Maternal Data    Feeding Feeding Type: Breast Fed  LATCH Score Latch: Grasps breast easily, tongue down, lips flanged, rhythmical sucking.  Audible Swallowing: A few with stimulation  Type of Nipple: Everted at rest and after stimulation  Comfort (Breast/Nipple): Soft / non-tender  Hold (Positioning): Assistance needed to correctly position infant at breast and maintain latch.  LATCH Score: 8  Interventions Interventions: Breast compression;Assisted with latch;Adjust position;Support pillows;Breast massage  Lactation Tools Discussed/Used     Consult Status Consult Status: Follow-up Date: 06/23/19 Follow-up type: In-patient    Huston Foley 06/22/2019, 3:21 PM

## 2019-06-22 NOTE — Lactation Note (Signed)
This note was copied from a baby's chart. Lactation Consultation Note  Patient Name: Norma Weber [id number] Date: 06/22/2019 Reason for consult: Follow-up assessment;Early term 65-38.6wks Baby is 41 hours old/7% weight loss.  Mom reports that baby cluster fed all night without ever acting full and satisfied.  Parents allowed baby to suck on finger between feedings.  Baby appears jaundice and bili will checked.  Baby is currently sleeping in crib.  It has been 6 hours since the last feeding.  Baby placed skin to skin in football hold and waking techniques done.  Mom is wearing shells and nipple everted.  Hand expression done and small drop expressed.  Baby very sleepy and lethargic.  No suck elicited.  24 mm nipple shield applied.  Baby gave a few weak sucks then fell asleep.  Discussed with parents the need for baby to receive some additional calories for energy to feed better.  Mom has been pumping but no colostrum obtained.  Mom agreeable to supplementation.  Offered donor milk or formula and mom chose formula.  Instructed on paced bottle feeding with Similac 19.  Baby did very well with bottle.  Instructed to give 10 mls, burp and offer more if baby still hungry.  Plan is to: breatfeed with cues using nipple shield if helpful, post pump every 3 hours and supplement with 10-30 mls of formula.  Encouraged to call for assist prn  Maternal Data    Feeding Feeding Type: Breast Fed  LATCH Score Latch: Too sleepy or reluctant, no latch achieved, no sucking elicited.  Audible Swallowing: None  Type of Nipple: Everted at rest and after stimulation  Comfort (Breast/Nipple): Soft / non-tender  Hold (Positioning): Assistance needed to correctly position infant at breast and maintain latch.  LATCH Score: 5  Interventions Interventions: Breast compression;Assisted with latch;Adjust position;Support pillows;Skin to skin;DEBP;Hand express  Lactation Tools Discussed/Used Tools: Nipple  Shields Nipple shield size: 24   Consult Status Consult Status: Follow-up Date: 06/23/19 Follow-up type: In-patient    Huston Foley 06/22/2019, 11:28 AM

## 2019-06-23 LAB — TYPE AND SCREEN
ABO/RH(D): O NEG
Antibody Screen: POSITIVE
Unit division: 0
Unit division: 0

## 2019-06-23 LAB — BPAM RBC
Blood Product Expiration Date: 202103052359
Blood Product Expiration Date: 202103092359
Unit Type and Rh: 9500
Unit Type and Rh: 9500

## 2019-09-03 DIAGNOSIS — H938X2 Other specified disorders of left ear: Secondary | ICD-10-CM | POA: Insufficient documentation

## 2019-12-05 ENCOUNTER — Other Ambulatory Visit: Payer: Self-pay

## 2019-12-05 ENCOUNTER — Ambulatory Visit
Admission: RE | Admit: 2019-12-05 | Discharge: 2019-12-05 | Disposition: A | Discharge status: Home / Self Care | Payer: 59 | Source: Ambulatory Visit

## 2019-12-05 VITALS — BP 139/82 | HR 97 | Temp 99.1°F | Resp 18

## 2019-12-05 DIAGNOSIS — Z1152 Encounter for screening for COVID-19: Secondary | ICD-10-CM

## 2019-12-05 DIAGNOSIS — H60502 Unspecified acute noninfective otitis externa, left ear: Secondary | ICD-10-CM | POA: Diagnosis not present

## 2019-12-05 DIAGNOSIS — J0100 Acute maxillary sinusitis, unspecified: Secondary | ICD-10-CM | POA: Diagnosis not present

## 2019-12-05 DIAGNOSIS — H9202 Otalgia, left ear: Secondary | ICD-10-CM

## 2019-12-05 MED ORDER — AMOXICILLIN-POT CLAVULANATE 875-125 MG PO TABS: 1.0000 | ORAL_TABLET | Freq: Two times a day (BID) | ORAL | 0 refills | Status: AC

## 2019-12-05 NOTE — ED Provider Notes (Signed)
[facility]   [id number] 12/05/19 Arrival Time: 0847  TM:AUQJ THROAT  SUBJECTIVE: History from: patient.  Norma Weber is a 29 y.o. female who presents with abrupt onset of nasal congestion, headache, fatigue, left ear pain for the last week. Reports that she has been battling allergies and sinus issues for the last 3 weeks.  Denies sick exposure to Covid, strep, flu or mono, or precipitating event. Has received both Covid vaccines. Has tried sudafed with temporary relief. There are no aggravating symptoms. Reports previous symptoms in the past.     Denies fever, chills,  rhinorrhea,  cough, SOB, wheezing, chest pain, nausea, rash, changes in bowel or bladder habits.     ROS: As per HPI.  All other pertinent ROS negative.     Past Medical History:  Diagnosis Date  . Anxiety   . GERD (gastroesophageal reflux disease)   . History of kidney stones   . Migraine    Past Surgical History:  Procedure Laterality Date  . HERNIA REPAIR     No Known Allergies No current facility-administered medications on file prior to encounter.   Current Outpatient Medications on File Prior to Encounter  Medication Sig Dispense Refill  . Norgestimate-Eth Estradiol (SPRINTEC 28 PO) Take by mouth.    Marland Kitchen acetaminophen (TYLENOL) 500 MG tablet Take 1,000 mg by mouth every 6 (six) hours as needed for mild pain.    . calcium carbonate (TUMS - DOSED IN MG ELEMENTAL CALCIUM) 500 MG chewable tablet Chew 2 tablets by mouth as needed for indigestion or heartburn.    . Prenatal Vit-Fe Fumarate-FA (PRENATAL MULTIVITAMIN) TABS tablet Take 1 tablet by mouth daily at 12 noon.    . sertraline (ZOLOFT) 100 MG tablet Take 150 mg by mouth daily.     Social History   Socioeconomic History  . Marital status: Married    Spouse name: Not on file  . Number of children: Not on file  . Years of education: Not on file  . Highest education level: Not on file  Occupational History  . Not on file  Tobacco Use    . Smoking status: Never Smoker  . Smokeless tobacco: Never Used  Vaping Use  . Vaping Use: Never used  Substance and Sexual Activity  . Alcohol use: Not Currently  . Drug use: No  . Sexual activity: Yes    Birth control/protection: Pill  Other Topics Concern  . Not on file  Social History Narrative  . Not on file   Social Determinants of Health   Financial Resource Strain:   . Difficulty of Paying Living Expenses:   Food Insecurity:   . Worried About Programme researcher, broadcasting/film/video in the Last Year:   . Barista in the Last Year:   Transportation Needs:   . Freight forwarder (Medical):   Marland Kitchen Lack of Transportation (Non-Medical):   Physical Activity:   . Days of Exercise per Week:   . Minutes of Exercise per Session:   Stress:   . Feeling of Stress :   Social Connections:   . Frequency of Communication with Friends and Family:   . Frequency of Social Gatherings with Friends and Family:   . Attends Religious Services:   . Active Member of Clubs or Organizations:   . Attends Banker Meetings:   Marland Kitchen Marital Status:   Intimate Partner Violence:   . Fear of Current or Ex-Partner:   . Emotionally Abused:   Marland Kitchen Physically Abused:   .  Sexually Abused:    Family History  Problem Relation Age of Onset  . Depression Mother   . Arthritis Mother   . Migraines Mother   . Depression Father     OBJECTIVE:  Vitals:   12/05/19 0931  BP: 139/82  Pulse: 97  Resp: 18  Temp: 99.1 F (37.3 C)  TempSrc: Oral  SpO2: 95%     General appearance: alert; appears fatigued, but nontoxic, speaking in full sentences and managing own secretions HEENT: NCAT; Ears: EACs clear, R TM pearly gray with visible cone of light, L TM erythematous and bulging ; Eyes: PERRL, EOMI grossly; Nose: no obvious rhinorrhea; Throat: oropharynx clear, tonsils 1+ and mildly erythematous without white tonsillar exudates, uvula midline Sinuses: Maxillary tenderness Neck: supple without LAD Lungs: CTA  bilaterally without adventitious breath sounds; cough absent Heart: regular rate and rhythm.  Radial pulses 2+ symmetrical bilaterally Skin: warm and dry Psychological: alert and cooperative; normal mood and affect  LABS: No results found for this or any previous visit (from the past 24 hour(s)).   ASSESSMENT & PLAN:  1. Acute non-recurrent maxillary sinusitis   2. Acute otalgia, left   3. Acute otitis externa of left ear, unspecified type     Meds ordered this encounter  Medications  . amoxicillin-clavulanate (AUGMENTIN) 875-125 MG tablet    Sig: Take 1 tablet by mouth 2 (two) times daily for 10 days.    Dispense:  20 tablet    Refill:  0    Order Specific Question:   Supervising Provider    Answer:   Merrilee Jansky [id number]    Acute Sinusitis Acute L AOM Push fluids and get rest Prescribed Augmentin twice daily for 10 days.   Take as directed and to completion.  Drink warm or cool liquids, use throat lozenges, or popsicles to help alleviate symptoms Take OTC ibuprofen or tylenol as needed for pain Follow up with PCP if symptoms persist Return or go to ER if you have any new or worsening symptoms such as fever, chills, nausea, vomiting, worsening sore throat, cough, abdominal pain, chest pain, changes in bowel or bladder habits.   Reviewed expectations re: course of current medical issues. Questions answered. Outlined signs and symptoms indicating need for more acute intervention. Patient verbalized understanding. After Visit Summary given.          Moshe Cipro, NP 12/05/19 1000

## 2019-12-05 NOTE — ED Triage Notes (Signed)
Pt c/o cough, scratchy throat, nasal congestion, and bilateral eye pressure x3 day.

## 2019-12-05 NOTE — Discharge Instructions (Addendum)
You have a sinus infection.  I have prescribed amoxicillin 875mg twice a day for 10 days.  Follow up with this office or with primary care if you are not feeling better over the next 2 days.  Follow up with the ER for trouble swallowing, trouble breathing, other concerning symptoms.  

## 2019-12-06 LAB — SARS-COV-2, NAA 2 DAY TAT

## 2019-12-06 LAB — NOVEL CORONAVIRUS, NAA: SARS-CoV-2, NAA: NOT DETECTED

## 2019-12-28 DIAGNOSIS — M79671 Pain in right foot: Secondary | ICD-10-CM | POA: Insufficient documentation

## 2020-02-21 ENCOUNTER — Ambulatory Visit: Payer: 59 | Admitting: Pulmonary Disease

## 2020-02-21 ENCOUNTER — Other Ambulatory Visit: Payer: Self-pay

## 2020-02-21 ENCOUNTER — Encounter: Payer: Self-pay | Admitting: Pulmonary Disease

## 2020-02-21 VITALS — BP 120/68 | HR 94 | Temp 96.3°F | Ht 70.0 in | Wt 190.0 lb

## 2020-02-21 DIAGNOSIS — G4733 Obstructive sleep apnea (adult) (pediatric): Secondary | ICD-10-CM

## 2020-02-21 DIAGNOSIS — Z23 Encounter for immunization: Secondary | ICD-10-CM

## 2020-02-21 NOTE — Progress Notes (Signed)
Norma Weber    [id number]    Sep 17, 1990  Primary Care Physician:Patient, No Pcp Per  Referring Physician: No referring provider defined for this encounter.  Chief complaint:   Patient being seen for history of snoring, witnessed apneas  HPI:  Has been told by spouse about snoring which is worse since she got pregnant and then she is still about 30 to 40 pounds heavier post pregnancy Has had snoring for many years Wakes up nonrestorative Usually goes to bed between 9 and 11 PM About 10 minutes to 1 hour of sleep onset 2-5 awakenings Final wake up time between 6 and 630  Admits to dryness of her throat in the mornings Not usually with a headache There is increased sweating at night Memory is good  Dad has obstructive sleep apnea  Does have some allergies and nasal stuffiness   Outpatient Encounter Medications as of 02/21/2020  Medication Sig  . acetaminophen (TYLENOL) 500 MG tablet Take 1,000 mg by mouth every 6 (six) hours as needed for mild pain.  . calcium carbonate (TUMS - DOSED IN MG ELEMENTAL CALCIUM) 500 MG chewable tablet Chew 2 tablets by mouth as needed for indigestion or heartburn.  . Loratadine (CLARITIN) 10 MG CAPS Take by mouth.  Suzzanne Cloud Estradiol (SPRINTEC 28 PO) Take by mouth.  . Prenatal Vit-Fe Fumarate-FA (PRENATAL MULTIVITAMIN) TABS tablet Take 1 tablet by mouth daily at 12 noon.  . promethazine (PHENERGAN) 25 MG tablet Take 25 mg by mouth every 6 (six) hours as needed for nausea or vomiting.  . sertraline (ZOLOFT) 100 MG tablet Take 150 mg by mouth daily.   No facility-administered encounter medications on file as of 02/21/2020.    Allergies as of 02/21/2020  . (No Known Allergies)    Past Medical History:  Diagnosis Date  . Anxiety   . GERD (gastroesophageal reflux disease)   . History of kidney stones   . Migraine     Past Surgical History:  Procedure Laterality Date  . HERNIA REPAIR      Family History    Problem Relation Age of Onset  . Depression Mother   . Arthritis Mother   . Migraines Mother   . Depression Father     Social History   Socioeconomic History  . Marital status: Married    Spouse name: Not on file  . Number of children: Not on file  . Years of education: Not on file  . Highest education level: Not on file  Occupational History  . Not on file  Tobacco Use  . Smoking status: Never Smoker  . Smokeless tobacco: Never Used  Vaping Use  . Vaping Use: Never used  Substance and Sexual Activity  . Alcohol use: Not Currently  . Drug use: No  . Sexual activity: Yes    Birth control/protection: Pill  Other Topics Concern  . Not on file  Social History Narrative  . Not on file   Social Determinants of Health   Financial Resource Strain:   . Difficulty of Paying Living Expenses: Not on file  Food Insecurity:   . Worried About Programme researcher, broadcasting/film/video in the Last Year: Not on file  . Ran Out of Food in the Last Year: Not on file  Transportation Needs:   . Lack of Transportation (Medical): Not on file  . Lack of Transportation (Non-Medical): Not on file  Physical Activity:   . Days of Exercise per Week: Not on  file  . Minutes of Exercise per Session: Not on file  Stress:   . Feeling of Stress : Not on file  Social Connections:   . Frequency of Communication with Friends and Family: Not on file  . Frequency of Social Gatherings with Friends and Family: Not on file  . Attends Religious Services: Not on file  . Active Member of Clubs or Organizations: Not on file  . Attends Banker Meetings: Not on file  . Marital Status: Not on file  Intimate Partner Violence:   . Fear of Current or Ex-Partner: Not on file  . Emotionally Abused: Not on file  . Physically Abused: Not on file  . Sexually Abused: Not on file    Review of Systems  HENT: Positive for congestion.   Respiratory: Positive for apnea.   Psychiatric/Behavioral: Positive for sleep  disturbance.    Vitals:   02/21/20 1534  BP: 120/68  Pulse: 94  Temp: (!) 96.3 F (35.7 C)  SpO2: 97%     Physical Exam HENT:     Head: Atraumatic.     Nose: No congestion.     Mouth/Throat:     Pharynx: No oropharyngeal exudate.     Comments: Mallampati 2, Eyes:     General:        Right eye: No discharge.        Left eye: No discharge.  Cardiovascular:     Rate and Rhythm: Normal rate and regular rhythm.     Pulses: Normal pulses.     Heart sounds: No murmur heard.   Pulmonary:     Effort: Pulmonary effort is normal. No respiratory distress.     Breath sounds: Normal breath sounds. No stridor. No wheezing or rhonchi.  Musculoskeletal:     Cervical back: No rigidity or tenderness.  Neurological:     Mental Status: She is alert.    Results of the Epworth flowsheet 02/21/2020  Sitting and reading 3  Watching TV 2  Sitting, inactive in a public place (e.g. a theatre or a meeting) 1  As a passenger in a car for an hour without a break 1  Lying down to rest in the afternoon when circumstances permit 2  Sitting and talking to someone 0  Sitting quietly after a lunch without alcohol 0  In a car, while stopped for a few minutes in traffic 0  Total score 9    Assessment:  Moderate probability of significant obstructive sleep apnea  Excessive daytime sleepiness  Significant snoring history  Pathophysiology of sleep disordered breathing discussed with the patient Treatment options for sleep disordered breathing discussed with the patient  Plan/Recommendations: Risks of not treating sleep disordered breathing discussed  Schedule patient for home sleep study  I will see her back in about 3 months  Flu shot to be administered today   Virl Diamond MD [facility] 02/21/2020, 4:06 PM  CC: No ref. provider found

## 2020-02-21 NOTE — Patient Instructions (Signed)
Moderate probability of significant obstructive sleep apnea  We will schedule you for home sleep study Update you wi not yetth results as soon as is available  Treatment options as we discussed  Flu shot today  Follow-up in 3 months Sleep Apnea Sleep apnea affects breathing during sleep. It causes breathing to stop for a short time or to become shallow. It can also increase the risk of:  Heart attack.  Stroke.  Being very overweight (obese).  Diabetes.  Heart failure.  Irregular heartbeat. The goal of treatment is to help you breathe normally again. What are the causes? There are three kinds of sleep apnea:  Obstructive sleep apnea. This is caused by a blocked or collapsed airway.  Central sleep apnea. This happens when the brain does not send the right signals to the muscles that control breathing.  Mixed sleep apnea. This is a combination of obstructive and central sleep apnea. The most common cause of this condition is a collapsed or blocked airway. This can happen if:  Your throat muscles are too relaxed.  Your tongue and tonsils are too large.  You are overweight.  Your airway is too small. What increases the risk?  Being overweight.  Smoking.  Having a small airway.  Being older.  Being female.  Drinking alcohol.  Taking medicines to calm yourself (sedatives or tranquilizers).  Having family members with the condition. What are the signs or symptoms?  Trouble staying asleep.  Being sleepy or tired during the day.  Getting angry a lot.  Loud snoring.  Headaches in the morning.  Not being able to focus your mind (concentrate).  Forgetting things.  Less interest in sex.  Mood swings.  Personality changes.  Feelings of sadness (depression).  Waking up a lot during the night to pee (urinate).  Dry mouth.  Sore throat. How is this diagnosed?  Your medical history.  A physical exam.  A test that is done when you are sleeping  (sleep study). The test is most often done in a sleep lab but may also be done at home. How is this treated?   Sleeping on your side.  Using a medicine to get rid of mucus in your nose (decongestant).  Avoiding the use of alcohol, medicines to help you relax, or certain pain medicines (narcotics).  Losing weight, if needed.  Changing your diet.  Not smoking.  Using a machine to open your airway while you sleep, such as: ? An oral appliance. This is a mouthpiece that shifts your lower jaw forward. ? A CPAP device. This device blows air through a mask when you breathe out (exhale). ? An EPAP device. This has valves that you put in each nostril. ? A BPAP device. This device blows air through a mask when you breathe in (inhale) and breathe out.  Having surgery if other treatments do not work. It is important to get treatment for sleep apnea. Without treatment, it can lead to:  High blood pressure.  Coronary artery disease.  In men, not being able to have an erection (impotence).  Reduced thinking ability. Follow these instructions at home: Lifestyle  Make changes that your doctor recommends.  Eat a healthy diet.  Lose weight if needed.  Avoid alcohol, medicines to help you relax, and some pain medicines.  Do not use any products that contain nicotine or tobacco, such as cigarettes, e-cigarettes, and chewing tobacco. If you need help quitting, ask your doctor. General instructions  Take over-the-counter and prescription medicines only  as told by your doctor.  If you were given a machine to use while you sleep, use it only as told by your doctor.  If you are having surgery, make sure to tell your doctor you have sleep apnea. You may need to bring your device with you.  Keep all follow-up visits as told by your doctor. This is important. Contact a doctor if:  The machine that you were given to use during sleep bothers you or does not seem to be working.  You do not  get better.  You get worse. Get help right away if:  Your chest hurts.  You have trouble breathing in enough air.  You have an uncomfortable feeling in your back, arms, or stomach.  You have trouble talking.  One side of your body feels weak.  A part of your face is hanging down. These symptoms may be an emergency. Do not wait to see if the symptoms will go away. Get medical help right away. Call your local emergency services (911 in the [place] Do not drive yourself to the hospital. Summary  This condition affects breathing during sleep.  The most common cause is a collapsed or blocked airway.  The goal of treatment is to help you breathe normally while you sleep. This information is not intended to replace advice given to you by your health care provider. Make sure you discuss any questions you have with your health care provider. Document Revised: 02/17/2018 Document Reviewed: 12/27/2017 Elsevier Patient Education  [place].

## 2020-03-04 ENCOUNTER — Other Ambulatory Visit: Payer: Self-pay

## 2020-03-04 ENCOUNTER — Ambulatory Visit
Admission: RE | Admit: 2020-03-04 | Discharge: 2020-03-04 | Disposition: A | Discharge status: Home / Self Care | Payer: 59 | Source: Ambulatory Visit | Attending: Emergency Medicine | Admitting: Emergency Medicine

## 2020-03-04 VITALS — BP 130/84 | HR 78 | Temp 99.0°F | Resp 18

## 2020-03-04 DIAGNOSIS — J069 Acute upper respiratory infection, unspecified: Secondary | ICD-10-CM | POA: Diagnosis not present

## 2020-03-04 DIAGNOSIS — Z20822 Contact with and (suspected) exposure to covid-19: Secondary | ICD-10-CM

## 2020-03-04 NOTE — Discharge Instructions (Signed)
Your COVID test is pending - it is important to quarantine / isolate at home until your results are back. °If you test positive and would like further evaluation for persistent or worsening symptoms, you may schedule an E-visit or virtual (video) visit throughout the [facility] MyChart app or website. ° °PLEASE NOTE: If you develop severe chest pain or shortness of breath please go to the ER or call 9-1-1 for further evaluation --> DO NOT schedule electronic or virtual visits for this. °Please call our office for further guidance / recommendations as needed. ° °For information about the Covid vaccine, please visit [email or web address] °

## 2020-03-04 NOTE — ED Triage Notes (Signed)
Pt here for nasal congestion with drainage and facial pain; pt sts some feelings of being off balance and denies fever

## 2020-03-04 NOTE — ED Provider Notes (Signed)
[facility]    CSN: [id number] Arrival date & time: 03/04/20  0851      History   Chief Complaint Chief Complaint  Patient presents with  . Nasal Congestion  . Generalized Body Aches    HPI Norma Weber is a 29 y.o. female  Presented for Covid testing.  Patient provides history: Endorsing URI symptoms including nasal congestion, postnasal drip, rhinorrhea and sinus pressure.  Has had intermittent, frontal headaches as well.  States intermittent "feeling off sensation ".  Denies room spinning sensation, change in vision or hearing, tinnitus, fever.  No chest pain, difficulty breathing.  Has very mild, intermittent, dry cough described as clear throat.  Past Medical History:  Diagnosis Date  . Anxiety   . GERD (gastroesophageal reflux disease)   . History of kidney stones   . Migraine     Patient Active Problem List   Diagnosis Date Noted  . Normal labor 06/20/2019    Past Surgical History:  Procedure Laterality Date  . HERNIA REPAIR      OB History    Gravida  1   Para  1   Term  1   Preterm      AB      Living  1     SAB      TAB      Ectopic      Multiple  0   Live Births  1            Home Medications    Prior to Admission medications   Medication Sig Start Date End Date Taking? Authorizing Provider  acetaminophen (TYLENOL) 500 MG tablet Take 1,000 mg by mouth every 6 (six) hours as needed for mild pain.    [provider]  calcium carbonate (TUMS - DOSED IN MG ELEMENTAL CALCIUM) 500 MG chewable tablet Chew 2 tablets by mouth as needed for indigestion or heartburn.    [provider]  Loratadine (CLARITIN) 10 MG CAPS Take by mouth.    [provider]  Norgestimate-Eth Estradiol (SPRINTEC 28 PO) Take by mouth.    [provider]  Prenatal Vit-Fe Fumarate-FA (PRENATAL MULTIVITAMIN) TABS tablet Take 1 tablet by mouth daily at 12 noon.    [provider]  promethazine (PHENERGAN)  25 MG tablet Take 25 mg by mouth every 6 (six) hours as needed for nausea or vomiting.    [provider]  sertraline (ZOLOFT) 100 MG tablet Take 150 mg by mouth daily.    [provider]    Family History Family History  Problem Relation Age of Onset  . Depression Mother   . Arthritis Mother   . Migraines Mother   . Depression Father     Social History Social History   Tobacco Use  . Smoking status: Never Smoker  . Smokeless tobacco: Never Used  Vaping Use  . Vaping Use: Never used  Substance Use Topics  . Alcohol use: Not Currently  . Drug use: No     Allergies   Patient has no known allergies.   Review of Systems As per HPI   Physical Exam Triage Vital Signs ED Triage Vitals  Enc Vitals Group     BP      Pulse      Resp      Temp      Temp src      SpO2      Weight      Height  Head Circumference      Peak Flow      Pain Score      Pain Loc      Pain Edu?      Excl. in GC?    No data found.  Updated Vital Signs BP 130/84 (BP Location: Right Arm)   Pulse 78   Temp 99 F (37.2 C) (Oral)   Resp 18   SpO2 96%   Visual Acuity Right Eye Distance:   Left Eye Distance:   Bilateral Distance:    Right Eye Near:   Left Eye Near:    Bilateral Near:     Physical Exam Constitutional:      General: She is not in acute distress. HENT:     Head: Normocephalic and atraumatic.     Right Ear: Tympanic membrane, ear canal and external ear normal.     Left Ear: Tympanic membrane, ear canal and external ear normal.     Mouth/Throat:     Mouth: Mucous membranes are moist.     Pharynx: Oropharynx is clear.  Eyes:     General: No scleral icterus.    Extraocular Movements: Extraocular movements intact.     Conjunctiva/sclera: Conjunctivae normal.     Pupils: Pupils are equal, round, and reactive to light.  Cardiovascular:     Rate and Rhythm: Normal rate.  Pulmonary:     Effort: Pulmonary effort is normal. No respiratory  distress.     Breath sounds: No wheezing.  Musculoskeletal:        General: No deformity. Normal range of motion.     Cervical back: Normal range of motion. No rigidity or tenderness.  Lymphadenopathy:     Cervical: No cervical adenopathy.  Skin:    Capillary Refill: Capillary refill takes less than 2 seconds.     Coloration: Skin is not jaundiced.     Findings: No bruising or rash.  Neurological:     Mental Status: She is alert.     Cranial Nerves: Cranial nerves are intact.     Sensory: Sensation is intact.     Motor: Motor function is intact.     Coordination: Coordination is intact.     Gait: Gait is intact.  Psychiatric:        Mood and Affect: Mood normal.        Behavior: Behavior normal.      UC Treatments / Results  Labs (all labs ordered are listed, but only abnormal results are displayed) Labs Reviewed  NOVEL CORONAVIRUS, NAA    EKG   Radiology No results found.  Procedures Procedures (including critical care time)  Medications Ordered in UC Medications - No data to display  Initial Impression / Assessment and Plan / UC Course  I have reviewed the triage vital signs and the nursing notes.  Pertinent labs & imaging results that were available during my care of the patient were reviewed by me and considered in my medical decision making (see chart for details).     Patient afebrile, nontoxic, with SpO2 96%.  Covid PCR pending.  Patient to quarantine until results are back.  We will treat supportively as outlined below.  Return precautions discussed, patient verbalized understanding and is agreeable to plan. Final Clinical Impressions(s) / UC Diagnoses   Final diagnoses:  Encounter for screening laboratory testing for COVID-19 virus  URI with cough and congestion     Discharge Instructions     Your COVID test is pending - it is important  to quarantine / isolate at home until your results are back. If you test positive and would like further  evaluation for persistent or worsening symptoms, you may schedule an E-visit or virtual (video) visit throughout the [facility] app or website.  PLEASE NOTE: If you develop severe chest pain or shortness of breath please go to the ER or call 9-1-1 for further evaluation --> DO NOT schedule electronic or virtual visits for this. Please call our office for further guidance / recommendations as needed.  For information about the Covid vaccine, please visit [email or web address]    ED Prescriptions    None     PDMP not reviewed this encounter.   Hall-Potvin, [place], [place] 03/04/20 1048

## 2020-03-06 LAB — SARS-COV-2, NAA 2 DAY TAT

## 2020-03-06 LAB — NOVEL CORONAVIRUS, NAA: SARS-CoV-2, NAA: NOT DETECTED

## 2020-03-13 DIAGNOSIS — J019 Acute sinusitis, unspecified: Secondary | ICD-10-CM | POA: Insufficient documentation

## 2020-04-01 ENCOUNTER — Ambulatory Visit: Payer: 59

## 2020-04-01 ENCOUNTER — Other Ambulatory Visit: Payer: Self-pay

## 2020-04-01 DIAGNOSIS — G4733 Obstructive sleep apnea (adult) (pediatric): Secondary | ICD-10-CM

## 2020-04-01 DIAGNOSIS — G4710 Hypersomnia, unspecified: Secondary | ICD-10-CM

## 2020-04-03 DIAGNOSIS — G4710 Hypersomnia, unspecified: Secondary | ICD-10-CM

## 2020-04-04 ENCOUNTER — Telehealth: Payer: Self-pay | Admitting: Pulmonary Disease

## 2020-04-04 NOTE — Telephone Encounter (Signed)
ATC patient went to voicemail, per DPR left detailed message of Dr. Johnnette Barrios recs. Advised patient to call us back if she would like to proceed with in lab sleep study. Nothing further needed at this time.

## 2020-04-04 NOTE — Telephone Encounter (Signed)
Call patient  Sleep study result  Date of study: 04/01/2020  Impression: Negative study for significant sleep disordered breathing No significant oxygen desaturations  Recommendation: Follow-up as needed  Study does not reveal significant sleep disordered breathing, about  25% of studies may be falsely negative In a situation where there is still significant concern for sleep disordered breathing, an in lab study may be beneficial  Adequate number of hours of sleep about 6 to 8 hours of sleep is recommended Regular exercise does help sleep quality  -If patient feels that there is still a significant concern-schedule an in lab study

## 2020-05-20 DIAGNOSIS — R09A2 Foreign body sensation, throat: Secondary | ICD-10-CM | POA: Insufficient documentation

## 2020-05-20 DIAGNOSIS — K219 Gastro-esophageal reflux disease without esophagitis: Secondary | ICD-10-CM | POA: Insufficient documentation

## 2020-05-20 DIAGNOSIS — R198 Other specified symptoms and signs involving the digestive system and abdomen: Secondary | ICD-10-CM | POA: Insufficient documentation

## 2020-05-20 DIAGNOSIS — R0989 Other specified symptoms and signs involving the circulatory and respiratory systems: Secondary | ICD-10-CM | POA: Insufficient documentation

## 2020-06-13 ENCOUNTER — Other Ambulatory Visit: Payer: 59

## 2020-06-15 DIAGNOSIS — U071 COVID-19: Secondary | ICD-10-CM | POA: Insufficient documentation

## 2020-06-16 ENCOUNTER — Other Ambulatory Visit: Payer: 59

## 2020-06-16 ENCOUNTER — Telehealth: Payer: 59 | Admitting: Nurse Practitioner

## 2020-06-16 ENCOUNTER — Telehealth: Payer: 59 | Admitting: Family Medicine

## 2020-06-16 ENCOUNTER — Encounter: Payer: Self-pay | Admitting: Nurse Practitioner

## 2020-06-16 DIAGNOSIS — U071 COVID-19: Secondary | ICD-10-CM | POA: Diagnosis not present

## 2020-06-16 NOTE — Progress Notes (Signed)
Virtual Visit Progress Note  Norma Weber,you are scheduled for a virtual visit with your provider today.    Just as we do with appointments in the office, we must obtain your consent to participate.  Your consent will be active for this visit and any virtual visit you may have with one of our providers in the next 365 days.    If you have a MyChart account, I can also send a copy of this consent to you electronically.  All virtual visits are billed to your insurance company just like a traditional visit in the office.  As this is a virtual visit, video technology does not allow for your provider to perform a traditional examination.  This may limit your provider's ability to fully assess your condition.  If your provider identifies any concerns that need to be evaluated in person or the need to arrange testing such as labs, EKG, etc, we will make arrangements to do so.    Although advances in technology are sophisticated, we cannot ensure that it will always work on either your end or our end.  If the connection with a video visit is poor, we may have to switch to a telephone visit.  With either a video or telephone visit, we are not always able to ensure that we have a secure connection.   I need to obtain your verbal consent now.   Are you willing to proceed with your visit today?   Norma Weber has provided verbal consent on 06/16/2020 for a virtual visit (video or telephone).   Norma Dooms, NP 06/16/2020  12:00 PM    I connected with Norma Weber on 06/16/20 at 12:15 PM EST by video enabled telemedicine visit and verified that I am speaking with the correct person using two identifiers.   I discussed the limitations, risks, security and privacy concerns of performing an evaluation and management service by telemedicine and the availability of in-person appointments. I also discussed with the patient that there may be a patient responsible charge related to this service. The patient  expressed understanding and agreed to proceed.   Other persons participating in the visit and their role in the encounter: none   Patient's location: home Provider's location: clinic  Chief Complaint: covid positive    Patient Care Team: Patient, Norma Weber as PCP - General (General Practice)   Name of the patient: Norma Weber  [id number]  1991/03/04   Date of visit: 06/16/20  History of Presenting Illness- Patient is 30 year old female with pmh of anxiety who presents for covid positive home antigen test. Her daughter, who is 10 months old, was exposed to positive family member last week.  4 days later was symptomatic and started on home test.  Patient started feeling poorly yesterday (06/15/2020) took rapid home test which was positive.  Symptomatic with nasal congestion, headache, chest congestion, cough, yellow phlegm, sore throat.  Chest feels tight but Norma pain.  Shortness of breath with exertion.  Lightheaded at times.  Fatigued easily.  Eating and drinking okay.  Appetite is reduced.  She received her vaccines but Norma booster.  Took Sudafed and Tylenol yesterday.  Review of systems- Review of Systems  Constitutional: Positive for malaise/fatigue. Negative for chills, diaphoresis, fever and weight loss.  HENT: Positive for congestion and sore throat. Negative for ear discharge, ear pain, hearing loss, nosebleeds, sinus pain and tinnitus.   Eyes: Negative for blurred vision, double vision, pain and redness.  Respiratory: Positive for cough and sputum production. Negative for hemoptysis, shortness of breath, wheezing and stridor.   Cardiovascular: Negative for chest pain, palpitations and leg swelling.  Gastrointestinal: Negative for abdominal pain, blood in stool, constipation, diarrhea, melena, nausea and vomiting.  Genitourinary: Negative for dysuria and urgency.  Musculoskeletal: Positive for back pain. Negative for falls, joint pain and myalgias.  Skin: Negative for itching and  rash.  Neurological: Positive for headaches. Negative for dizziness, tingling, sensory change, loss of consciousness and weakness.  Endo/Heme/Allergies: Negative for environmental allergies. Does not bruise/bleed easily.  Psychiatric/Behavioral: Negative for depression. The patient is not nervous/anxious and does not have insomnia.   All other systems reviewed and are negative.    Norma Known Allergies  Past Medical History:  Diagnosis Date  . Anxiety   . GERD (gastroesophageal reflux disease)   . History of kidney stones   . Migraine     Past Surgical History:  Procedure Laterality Date  . HERNIA REPAIR      Social History   Socioeconomic History  . Marital status: Married    Spouse name: Not on file  . Number of children: Not on file  . Years of education: Not on file  . Highest education level: Not on file  Occupational History  . Not on file  Tobacco Use  . Smoking status: Never Smoker  . Smokeless tobacco: Never Used  Vaping Use  . Vaping Use: Never used  Substance and Sexual Activity  . Alcohol use: Not Currently  . Drug use: Norma  . Sexual activity: Yes    Birth control/protection: Pill  Other Topics Concern  . Not on file  Social History Narrative  . Not on file   Social Determinants of Health   Financial Resource Strain: Not on file  Food Insecurity: Not on file  Transportation Needs: Not on file  Physical Activity: Not on file  Stress: Not on file  Social Connections: Not on file  Intimate Partner Violence: Not on file    Immunization History  Administered Date(s) Administered  . Influenza,inj,Quad PF,6+ Mos 02/21/2020  . Moderna Sars-Covid-2 Vaccination 10/13/2019, 11/12/2019    Family History  Problem Relation Age of Onset  . Depression Mother   . Arthritis Mother   . Migraines Mother   . Depression Father      Current Outpatient Medications:  .  acetaminophen (TYLENOL) 500 MG tablet, Take 1,000 mg by mouth every 6 (six) hours as needed  for mild pain., Disp: , Rfl:  .  calcium carbonate (TUMS - DOSED IN MG ELEMENTAL CALCIUM) 500 MG chewable tablet, Chew 2 tablets by mouth as needed for indigestion or heartburn., Disp: , Rfl:  .  Loratadine (CLARITIN) 10 MG CAPS, Take by mouth., Disp: , Rfl:  .  Norgestimate-Eth Estradiol (SPRINTEC 28 PO), Take by mouth., Disp: , Rfl:  .  Prenatal Vit-Fe Fumarate-FA (PRENATAL MULTIVITAMIN) TABS tablet, Take 1 tablet by mouth daily at 12 noon., Disp: , Rfl:  .  promethazine (PHENERGAN) 25 MG tablet, Take 25 mg by mouth every 6 (six) hours as needed for nausea or vomiting., Disp: , Rfl:  .  sertraline (ZOLOFT) 100 MG tablet, Take 150 mg by mouth daily., Disp: , Rfl:   Physical exam: Exam limited due to telemedicine Physical Exam Constitutional:      General: She is not in acute distress.    Appearance: She is ill-appearing.  HENT:     Head: Normocephalic.     Nose: Congestion present.  Pulmonary:     Effort: Norma respiratory distress.     Comments: Speaking in full sentences with Norma obvious shortness of breath. Strong cough.  Neurological:     Mental Status: She is alert and oriented to person, place, and time.  Psychiatric:        Mood and Affect: Mood normal.        Behavior: Behavior normal.     Assessment and plan- Patient is a 30 y.o. female diagnosed with positive home antigen test who presents for evaluation.   1) Covid-19 Virus Infection - I advised patient to stay well hydrated, particularly in the event of sustained or high fevers, in whom insensible fluid losses may be significant  - Cough that is persistent, interferes with sleep, or causes discomfort can be managed with an over-the-counter cough medication (eg, dextromethorphan) or prescription benzonatate, 100 to 200 mg orally three times daily as needed. Prescription sent.  - I recommend rest as needed during the acute illness and frequent repositioning ambulation is encouraged. In addition, we encourage all patients to  advance activity as soon as tolerated during recovery. - take mucinex to thin secretions Weber otc label.  - take tylenol and/or ibuprofen Weber label as needed for pain/fever/ body aches. Do not exceed 3000 mg in 24 hour period of tylenol - Reviewed symptoms that would warrant ER or hospital care - encouraged patient to monitor oxygen levels with pulse oximeter, reviewed troubleshooting, and to seek ER for SpO2 < 94% - CDC criteria for quarantine and isolation reviewed - In non-hospitalized patients, we do not treat COVID-19 with dexamethasone, prednisone, or other corticosteroids. Weber the literature, extrapolating from the results of studies of hospitalized patients, there is Norma evidence that corticosteroids benefit patients without a supplemental oxygen requirement, and further, they may be associated with poorer clinical outcomes - There is not high-quality data that suggests that supplementation with vitamin C, vitamin D, or zinc reduces the severity of COVID-19 in non-hospitalized patients - I do recommend breathing exercises though they haven't been studied in patients with covid-19.  - Some patients with cough or dyspnea may experience symptomatic improvement with self-proning (resting in the prone rather than the supine position) - For patients with documented COVID-19, we do not routinely administer empiric therapy for bacterial pneumonia. Data are limited, but bacterial superinfection does not appear to be a prominent feature of COVID-19  2) Chest congestion  - Flonase or nasocort as needed. Afrin for nasal congestion Norma longer than 5 days.  - humidifier for comfort  Ref to covid treatment team for possible mab or antiviral though with limited comorbidities, drug shortages, and high case positivity, likely will not receive treatment.   Follow up if worsening symptoms.    Visit Diagnosis 1. COVID-19 virus infection    Patient expressed understanding and was in agreement with this plan.  She also understands that She can call clinic at any time with any questions, concerns, or complaints.   I discussed the assessment and treatment plan with the patient. The patient was provided an opportunity to ask questions and all were answered. The patient agreed with the plan and demonstrated an understanding of the instructions.   The patient was advised to call back or seek an in-person evaluation if the symptoms worsen or if the condition fails to improve as anticipated.   I spent 22 minutes face-to-face video visit time dedicated to the care of this patient on the date of this encounter to include pre-visit review of  previous notes, face-to-face time with the patient, and post visit ordering of testing/documentation.   Thank you for allowing me to participate in the care of this very pleasant patient.   Consuello Masse, DNP, AGNP-C [facility]

## 2020-06-16 NOTE — Patient Instructions (Addendum)

## 2020-06-16 NOTE — Progress Notes (Signed)
Attempted to call patient and no answer.  Alric Seton, MD 06/16/2020 11:52 AM

## 2020-06-17 ENCOUNTER — Other Ambulatory Visit: Payer: Self-pay | Admitting: Nurse Practitioner

## 2020-06-17 ENCOUNTER — Encounter: Payer: Self-pay | Admitting: Nurse Practitioner

## 2020-06-17 ENCOUNTER — Telehealth (HOSPITAL_COMMUNITY): Payer: Self-pay | Admitting: Family

## 2020-06-17 MED ORDER — BENZONATATE 100 MG PO CAPS: 200.0000 mg | ORAL_CAPSULE | Freq: Three times a day (TID) | ORAL | 0 refills | Status: DC | PRN

## 2020-06-17 NOTE — Telephone Encounter (Signed)
Called to discuss with patient about COVID-19 symptoms and the use of one of the available treatments for those with mild to moderate Covid symptoms and at a high risk of hospitalization.  Pt appears to qualify for outpatient treatment due to co-morbid conditions and/or a member of an at-risk group in accordance with the FDA Emergency Use Authorization.    Unable to reach pt - VM left  Najma Bozarth   

## 2020-06-18 ENCOUNTER — Other Ambulatory Visit (HOSPITAL_COMMUNITY): Payer: Self-pay | Admitting: Family

## 2020-06-18 MED ORDER — MOLNUPIRAVIR EUA 200MG CAPSULE: 4.0000 | ORAL_CAPSULE | Freq: Two times a day (BID) | ORAL | 0 refills | Status: AC

## 2020-06-18 MED FILL — MOLNUPIRAVIR 200 MG CAPS: 200 | 5 days supply | Qty: 40 | Fill #0

## 2020-06-18 NOTE — Addendum Note (Signed)
Addended by: Alver Sorrow on: 06/18/2020 10:25 AM   Modules accepted: Orders

## 2020-06-18 NOTE — Telephone Encounter (Signed)
Outpatient Oral COVID Treatment Note  I connected with Norma Weber on 06/18/2020/10:19 AM by telephone and verified that I am speaking with the correct person using two identifiers.  I discussed the limitations, risks, security, and privacy concerns of performing an evaluation and management service by telephone and the availability of in person appointments. I also discussed with the patient that there may be a patient responsible charge related to this service. The patient expressed understanding and agreed to proceed.  Patient location: [place] residence Provider location: Home  Diagnosis: COVID-19 infection  Purpose of visit: Discussion of potential use of Molnupiravir or Paxlovid, a new treatment for mild to moderate COVID-19 viral infection in non-hospitalized patients.   Subjective: Patient is a 30 y.o. female who has been diagnosed with COVID 19 viral infection.  Their symptoms began on 06/15/20 with congestion, cough.    Past Medical History:  Diagnosis Date  . Anxiety   . GERD (gastroesophageal reflux disease)   . History of kidney stones   . Migraine     No Known Allergies   Current Outpatient Medications:  .  benzonatate (TESSALON) 100 MG capsule, Take 2 capsules (200 mg total) by mouth 3 (three) times daily as needed for cough., Disp: 60 capsule, Rfl: 0 .  acetaminophen (TYLENOL) 500 MG tablet, Take 1,000 mg by mouth every 6 (six) hours as needed for mild pain., Disp: , Rfl:  .  ALPRAZolam (XANAX) 0.25 MG tablet, Take 0.25 mg by mouth as needed., Disp: , Rfl:  .  hydrocortisone 2.5 % cream, Apply topically., Disp: , Rfl:  .  norethindrone (MICRONOR) 0.35 MG tablet, Take by mouth., Disp: , Rfl:  .  Norgestimate-Eth Estradiol (SPRINTEC 28 PO), Take by mouth., Disp: , Rfl:  .  omeprazole (PRILOSEC) 40 MG capsule, Take by mouth., Disp: , Rfl:  .  promethazine (PHENERGAN) 25 MG tablet, Take 25 mg by mouth every 6 (six) hours as needed for nausea or vomiting., Disp: , Rfl:  .   sertraline (ZOLOFT) 100 MG tablet, Take 150 mg by mouth daily., Disp: , Rfl:   Objective: Patient appears/sounds well.  They are in no apparent distress.  Breathing is non labored.  Mood and behavior are normal.  Laboratory Data:  No results found for this or any previous visit (from the past 2160 hour(s)).   Assessment: 30 y.o. female with mild/moderate COVID 19 viral infection diagnosed on 06/15/20 via home test at high risk for progression to severe COVID 19.  Plan:  This patient is a 30 y.o. female that meets the following criteria for Emergency Use Authorization of: Molnupiravir  1. Age >18 yr 2. SARS-COV-2 positive test 3. Symptom onset < 5 days 4. Mild-to-moderate COVID disease with high risk for severe progression to hospitalization or death   I have spoken and communicated the following to the patient or parent/caregiver regarding: 1. Molnupiravir is an unapproved drug that is authorized for use under an TEFL teacher.  2. There are no adequate, approved, available products for the treatment of COVID-19 in adults who have mild-to-moderate COVID-19 and are at high risk for progressing to severe COVID-19, including hospitalization or death. 3. Other therapeutics are currently authorized. For additional information on all products authorized for treatment or prevention of COVID-19, please see https://www.graham-miller.com/.  4. There are benefits and risks of taking this treatment as outlined in the "Fact Sheet for Patients and Caregivers."  5. "Fact Sheet for Patients and Caregivers" was reviewed with patient. A hard copy will  be provided to patient from pharmacy prior to the patient receiving treatment. 6. Patients should continue to self-isolate and use infection control measures (e.g., wear mask, isolate, social distance, avoid sharing personal items, clean and disinfect "high  touch" surfaces, and frequent handwashing) according to CDC guidelines.  7. The patient or parent/caregiver has the option to accept or refuse treatment. 8. [employer] has established a pregnancy surveillance program. 9. Females of childbearing potential should use a reliable method of contraception correctly and consistently, as applicable, for the duration of treatment and for 4 days after the last dose of Molnupiravir. 10. Males of reproductive potential who are sexually active with females of childbearing potential should use a reliable method of contraception correctly and consistently during treatment and for at least 3 months after the last dose. 11. Pregnancy status and risk was assessed. Patient verbalized understanding of precautions.   After reviewing above information with the patient, the patient agrees to receive molnupiravir.  Her LMP was 2 weeks ago and she verbalized understanding to continue her oral contraceptive.   Follow up instructions:    . Take prescription BID x 5 days as directed . Reach out to pharmacist for counseling on medication if desired . For concerns regarding further COVID symptoms please follow up with your PCP or urgent care . For urgent or life-threatening issues, seek care at your local emergency department  The patient was provided an opportunity to ask questions, and all were answered. The patient agreed with the plan and demonstrated an understanding of the instructions.   Script sent to [facility] and opted to pick up RX.  The patient was advised to call their PCP or seek an in-person evaluation if the symptoms worsen or if the condition fails to improve as anticipated.   I provided 14 minutes of non face-to-face telephone visit time during this encounter, and > 50% was spent counseling as documented under my assessment & plan.  Alver Sorrow, NP 06/18/2020 /10:19 AM
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
## 2020-10-30 DIAGNOSIS — M545 Low back pain, unspecified: Secondary | ICD-10-CM | POA: Insufficient documentation

## 2021-01-26 DIAGNOSIS — M6281 Muscle weakness (generalized): Secondary | ICD-10-CM | POA: Insufficient documentation

## 2021-05-21 ENCOUNTER — Other Ambulatory Visit: Payer: Self-pay

## 2021-05-21 ENCOUNTER — Ambulatory Visit
Admission: RE | Admit: 2021-05-21 | Discharge: 2021-05-21 | Disposition: A | Discharge status: Home / Self Care | Payer: 59 | Source: Ambulatory Visit

## 2021-05-21 VITALS — BP 128/87 | HR 90 | Temp 99.7°F | Resp 18 | Ht 70.0 in | Wt 190.0 lb

## 2021-05-21 DIAGNOSIS — J069 Acute upper respiratory infection, unspecified: Secondary | ICD-10-CM | POA: Diagnosis not present

## 2021-05-21 LAB — POCT INFLUENZA A/B
Influenza A, POC: NEGATIVE
Influenza B, POC: NEGATIVE

## 2021-05-21 MED ORDER — ALBUTEROL SULFATE HFA 108 (90 BASE) MCG/ACT IN AERS: 1.0000 | INHALATION_SPRAY | Freq: Four times a day (QID) | RESPIRATORY_TRACT | 0 refills | Status: DC | PRN

## 2021-05-21 MED ORDER — BENZONATATE 100 MG PO CAPS: 100.0000 mg | ORAL_CAPSULE | Freq: Three times a day (TID) | ORAL | 0 refills | Status: DC | PRN
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
## 2021-05-21 NOTE — ED Provider Notes (Signed)
[facility]    CSN: [id number] Arrival date & time: 05/21/21  0955      History   Chief Complaint Chief Complaint  Patient presents with   Appointment    Sinus Problems    HPI Norma Weber is a 31 y.o. female.   Patient presents with nasal congestion, chest tightness, cough that has been present for approximately 4 days.  Patient denies shortness of breath but does report that it feels like her chest is tight.  Denies chest pain.  Denies any known fevers.  She reports that she has been exposed to the flu but that has been approximately 1 to 2 weeks ago.  Has been taking Mucinex and Tylenol with minimal improvement in symptoms.  Denies sore throat, ear pain, nausea, vomiting, diarrhea, abdominal pain.    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 Authorizing Provider  acetaminophen (TYLENOL) 500 MG tablet Take 1,000 mg by mouth every 6 (six) hours as needed for mild pain.   Yes [provider]  albuterol (VENTOLIN HFA) 108 (90 Base) MCG/ACT inhaler Inhale 1-2 puffs into the lungs every 6 (six) hours as needed for wheezing or shortness of breath. 05/21/21  Yes Mkayla Steele, Michele Rockers, FNP  ALPRAZolam Duanne Moron) 0.25 MG tablet Take 0.25 mg by mouth as needed. 04/19/20  Yes [provider]  benzonatate (TESSALON) 100 MG capsule Take 1 capsule (100 mg total) by mouth every 8 (eight) hours as needed for cough. 05/21/21  Yes Ertha Nabor, Hildred Alamin E, FNP  hydrocortisone 2.5 % cream Apply topically. 05/30/20  Yes [provider]  omeprazole (PRILOSEC) 40 MG capsule Take by mouth. 05/30/20  Yes [provider]  sertraline (ZOLOFT) 100 MG tablet Take 150 mg by mouth daily.   Yes [provider]  traMADol (ULTRAM) 50 MG tablet Take 50 mg by mouth 3 (three) times daily as needed. 03/02/21  Yes [provider]  Molnupiravir 200 MG CAPS TAKE 4 CAPSULES BY MOUTH 2 TIMES DAILY FOR 5 DAYS. 06/18/20 06/18/21  Laurann Montana  S, NP  norethindrone (MICRONOR) 0.35 MG tablet Take by mouth. 08/31/19   [provider]  Norgestimate-Eth Estradiol (SPRINTEC 28 PO) Take by mouth.    [provider]  promethazine (PHENERGAN) 25 MG tablet Take 25 mg by mouth every 6 (six) hours as needed for nausea or vomiting.    [provider]    Family History Family History  Problem Relation Age of Onset   Depression Mother    Arthritis Mother    Migraines Mother    Depression Father     Social History Social History   Tobacco Use   Smoking status: Never   Smokeless tobacco: Never  Vaping Use   Vaping Use: Never used  Substance Use Topics   Alcohol use: Not Currently   Drug use: No     Allergies   Patient has no known allergies.   Review of Systems Review of Systems Per HPI  Physical  Exam Triage Vital Signs ED Triage Vitals  Enc Vitals Group     BP 05/21/21 1017 128/87     Pulse Rate 05/21/21 1017 90     Resp 05/21/21 1017 18     Temp 05/21/21 1017 99.7 F (37.6 C)     Temp Source 05/21/21 1017 Oral     SpO2 05/21/21 1017 97 %     Weight 05/21/21 1018 190 lb (86.2 kg)     Height 05/21/21 1018 5\' 10"  (1.778 m)     Head Circumference --      Peak Flow --      Pain Score 05/21/21 1018 6     Pain Loc --      Pain Edu? --      Excl. in [place]? --    No data found.  Updated Vital Signs BP 128/87 (BP Location: Left Arm)    Pulse 90    Temp 99.7 F (37.6 C) (Oral)    Resp 18    Ht 5\' 10"  (1.778 m)    Wt 190 lb (86.2 kg)    LMP 05/03/2021 (Exact Date)    SpO2 97%    Breastfeeding No    BMI 27.26 kg/m   Visual Acuity Right Eye Distance:   Left Eye Distance:   Bilateral Distance:    Right Eye Near:   Left Eye Near:    Bilateral Near:     Physical Exam Constitutional:      General: She is not in acute distress.    Appearance: Normal appearance. She is not toxic-appearing or diaphoretic.  HENT:     Head: Normocephalic and atraumatic.     Right Ear: Tympanic membrane and ear canal normal.     Left Ear: Tympanic membrane and ear canal normal.     Nose: Congestion present.     Mouth/Throat:     Mouth: Mucous membranes are moist.     Pharynx: No posterior oropharyngeal erythema.  Eyes:     Extraocular Movements: Extraocular movements intact.     Conjunctiva/sclera: Conjunctivae normal.     Pupils: Pupils are equal, round, and reactive to light.  Cardiovascular:     Rate and Rhythm: Normal rate and regular rhythm.     Pulses: Normal pulses.     Heart sounds: Normal heart sounds.  Pulmonary:     Effort: Pulmonary effort is normal. No respiratory distress.     Breath sounds: Normal breath sounds. No stridor. No wheezing, rhonchi or rales.  Abdominal:     General: Abdomen is flat.  Bowel sounds are normal.     Palpations: Abdomen is soft.  Musculoskeletal:         General: Normal range of motion.     Cervical back: Normal range of motion.  Skin:    General: Skin is warm and dry.  Neurological:     General: No focal deficit present.     Mental Status: She is alert and oriented to person, place, and time. Mental status is at baseline.  Psychiatric:        Mood and Affect: Mood normal.        Behavior: Behavior normal.     UC Treatments / Results  Labs (all labs ordered are listed, but only abnormal results are displayed) Labs Reviewed  NOVEL CORONAVIRUS, NAA  POCT INFLUENZA A/B    EKG   Radiology No results found.  Procedures Procedures (including critical care time)  Medications Ordered in UC Medications - No data to display  Initial Impression / Assessment and Plan / UC Course  I have reviewed the triage vital signs and the nursing notes.  Pertinent labs & imaging results that were available during my care of the patient were reviewed by me and considered in my medical decision making (see chart for details).    Patient presents with symptoms likely from a viral upper respiratory infection. Differential includes bacterial pneumonia, sinusitis, allergic rhinitis, COVID-19, flu. Do not suspect underlying cardiopulmonary process. Symptoms seem unlikely related to ACS, CHF or COPD exacerbations, pneumonia, pneumothorax. Patient is nontoxic appearing and not in need of emergent medical intervention.  Rapid flu was negative. Rapid flu was negative. Covid 19 test pending.   Recommended symptom control with over the counter medications.  Albuterol inhaler prescribed to take as needed for chest tightness.  Return if symptoms fail to improve in 1-2 weeks or you develop shortness of breath, chest pain, severe headache. Patient states understanding and is agreeable.  Discharged with PCP followup.   Final Clinical Impressions(s) / UC Diagnoses   Final diagnoses:  Viral upper respiratory tract infection with cough     Discharge  Instructions      You have a viral upper respiratory infection that should resolve on its own in the next few days.  You have been prescribed albuterol inhaler to help alleviate chest tightness as well as a cough medication.  Your rapid flu was negative.  COVID test is pending.     ED Prescriptions     Medication Sig Dispense Auth. Provider   albuterol (VENTOLIN HFA) 108 (90 Base) MCG/ACT inhaler Inhale 1-2 puffs into the lungs every 6 (six) hours as needed for wheezing or shortness of breath. 1 each [place], [place] E, [place]   benzonatate (TESSALON) 100 MG capsule Take 1 capsule (100 mg total) by mouth every 8 (eight) hours as needed for cough. 21 capsule [place], Michele Rockers, [place]      PDMP not reviewed this encounter.   Teodora Medici, [place] 05/21/21 [phone]

## 2021-05-21 NOTE — Discharge Instructions (Addendum)
You have a viral upper respiratory infection that should resolve on its own in the next few days.  You have been prescribed albuterol inhaler to help alleviate chest tightness as well as a cough medication.  Your rapid flu was negative.  COVID test is pending.

## 2021-05-22 LAB — SARS-COV-2, NAA 2 DAY TAT

## 2021-05-22 LAB — NOVEL CORONAVIRUS, NAA: SARS-CoV-2, NAA: NOT DETECTED

## 2021-06-28 ENCOUNTER — Other Ambulatory Visit: Payer: Self-pay

## 2021-06-28 ENCOUNTER — Ambulatory Visit
Admission: EM | Admit: 2021-06-28 | Discharge: 2021-06-28 | Disposition: A | Discharge status: Home / Self Care | Payer: 59 | Attending: Internal Medicine | Admitting: Internal Medicine

## 2021-06-28 ENCOUNTER — Encounter: Payer: Self-pay | Admitting: Emergency Medicine

## 2021-06-28 DIAGNOSIS — J0100 Acute maxillary sinusitis, unspecified: Secondary | ICD-10-CM | POA: Diagnosis not present

## 2021-06-28 MED ORDER — AMOXICILLIN 875 MG PO TABS: 875.0000 mg | ORAL_TABLET | Freq: Two times a day (BID) | ORAL | 0 refills | Status: AC

## 2021-06-28 NOTE — Discharge Instructions (Signed)
It appears that you have a sinus infection so you are being treated with antibiotic.  Please follow-up if symptoms persist or worsen.

## 2021-06-28 NOTE — ED Provider Notes (Signed)
[facility]    CSN: [id number] Arrival date & time: 06/28/21  1424      History   Chief Complaint Chief Complaint  Patient presents with   URI    Sinus    HPI Norma Weber is a 31 y.o. female.   Patient presents with nasal congestion, sinus pressure, headache that has been present for approximately 1 week.  Patient has taken Sudafed, Tylenol, Flonase, ibuprofen, saline nasal spray with no improvement in symptoms.  Denies any known fevers or sick contacts.  Denies chest pain, shortness of breath, cough, sore throat, ear pain, nausea, vomiting, diarrhea, abdominal pain.    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 Authorizing Provider  acetaminophen (TYLENOL) 500 MG tablet Take 1,000 mg by mouth every 6 (six) hours as needed for  mild pain.   Yes [provider]  albuterol (VENTOLIN HFA) 108 (90 Base) MCG/ACT inhaler Inhale 1-2 puffs into the lungs every 6 (six) hours as needed for wheezing or shortness of breath. 05/21/21  Yes Lawarence Meek, Michele Rockers, FNP  ALPRAZolam Duanne Moron) 0.25 MG tablet Take 0.25 mg by mouth as needed. 04/19/20  Yes [provider]  amoxicillin (AMOXIL) 875 MG tablet Take 1 tablet (875 mg total) by mouth 2 (two) times daily for 7 days. 06/28/21 07/05/21 Yes Lloyd Ayo, Michele Rockers, FNP  benzonatate (TESSALON) 100 MG capsule Take 1 capsule (100 mg total) by mouth every 8 (eight) hours as needed for cough. 05/21/21  Yes Nyle Limb, Hildred Alamin E, FNP  hydrocortisone 2.5 % cream Apply topically. 05/30/20  Yes [provider]  norethindrone (MICRONOR) 0.35 MG tablet Take by mouth. 08/31/19  Yes [provider]  Norgestimate-Eth Estradiol (SPRINTEC 28 PO) Take by mouth.   Yes [provider]  omeprazole (PRILOSEC) 40 MG capsule Take by mouth. 05/30/20  Yes [provider]  promethazine (PHENERGAN) 25 MG tablet Take 25 mg by mouth every 6 (six) hours as needed for nausea or vomiting.   Yes [provider]  sertraline (ZOLOFT) 100 MG tablet Take  150 mg by mouth daily.   Yes [provider]  traMADol (ULTRAM) 50 MG tablet Take 50 mg by mouth 3 (three) times daily as needed. 03/02/21  Yes [provider]    Family History Family History  Problem Relation Age of Onset   Depression Mother    Arthritis Mother    Migraines Mother    Depression Father     Social History Social History   Tobacco Use   Smoking status: Never   Smokeless tobacco: Never  Vaping Use   Vaping Use: Never used  Substance Use Topics   Alcohol use: Not Currently   Drug use: No     Allergies   Patient has no known allergies.   Review of Systems Review of Systems Per HPI  Physical Exam Triage Vital Signs ED Triage Vitals  Enc Vitals Group     BP 06/28/21 1507 114/77      Pulse Rate 06/28/21 1507 68     Resp 06/28/21 1507 18     Temp 06/28/21 1507 98.9 F (37.2 C)     Temp Source 06/28/21 1507 Oral     SpO2 06/28/21 1507 97 %     Weight 06/28/21 1509 190 lb (86.2 kg)     Height 06/28/21 1509 5\' 10"  (1.778 m)     Head Circumference --      Peak Flow --      Pain Score 06/28/21 1508 6     Pain Loc --      Pain Edu? --      Excl. in [place]? --    No data found.  Updated Vital Signs BP 114/77 (BP Location: Right Arm)    Pulse 68    Temp 98.9 F (37.2 C) (Oral)    Resp 18    Ht 5\' 10"  (1.778 m)    Wt 190 lb (86.2 kg)    LMP 06/26/2021    SpO2 97%    BMI 27.26 kg/m   Visual Acuity Right Eye Distance:   Left Eye Distance:   Bilateral Distance:    Right Eye Near:   Left Eye Near:    Bilateral Near:     Physical Exam Constitutional:      General: She is not in acute distress.    Appearance: Normal appearance. She is not toxic-appearing or diaphoretic.  HENT:     Head: Normocephalic and atraumatic.     Right Ear: Tympanic membrane and ear canal normal.     Left Ear: Tympanic membrane and ear canal normal.     Nose: Congestion present.     Right Sinus: Maxillary sinus tenderness present.     Left Sinus: Maxillary sinus tenderness present.     Mouth/Throat:     Mouth: Mucous membranes are moist.     Pharynx: No posterior oropharyngeal erythema.  Eyes:     Extraocular Movements: Extraocular movements intact.     Conjunctiva/sclera: Conjunctivae normal.     Pupils: Pupils are equal, round, and reactive to light.  Cardiovascular:     Rate and Rhythm: Normal rate and regular rhythm.     Pulses: Normal pulses.     Heart sounds: Normal heart sounds.  Pulmonary:     Effort: Pulmonary effort is normal. No respiratory distress.     Breath sounds: Normal breath sounds. No stridor. No wheezing, rhonchi or rales.  Abdominal:     General: Abdomen is flat. Bowel sounds are normal.     Palpations: Abdomen is soft.  Musculoskeletal:        General: Normal  range of motion.     Cervical back: Normal range of motion.  Skin:    General: Skin is warm and dry.  Neurological:     General: No focal deficit present.     Mental Status: She is alert and oriented to person, place, and time. Mental status is at baseline.  Psychiatric:        Mood and Affect: Mood normal.        Behavior: Behavior normal.     UC Treatments / Results  Labs (all labs ordered are listed, but only abnormal results are displayed) Labs Reviewed - No data to display  EKG   Radiology No results found.  Procedures Procedures (including critical care time)  Medications Ordered in UC Medications - No data to display  Initial Impression / Assessment and Plan / UC Course  I have reviewed the triage vital signs and the nursing notes.  Pertinent labs & imaging results that were available during my care of the patient were reviewed by me and considered in my medical decision making (see chart for details).     Physical exam is consistent with acute sinusitis.  Will treat with amoxicillin antibiotic.  Discussed supportive care and symptom management as well.  Discussed strict return precautions.  Patient verbalized understanding and was agreeable with plan. Final Clinical Impressions(s) / UC Diagnoses   Final diagnoses:  Acute non-recurrent maxillary sinusitis     Discharge Instructions      It appears that you have a sinus infection so you are being treated with antibiotic.  Please follow-up if symptoms persist or worsen.    ED Prescriptions     Medication Sig Dispense Auth. Provider   amoxicillin (AMOXIL) 875 MG tablet Take 1 tablet (875 mg total) by mouth 2 (two) times daily for 7 days. 14 tablet [place], Michele Rockers, [place]      PDMP not reviewed this encounter.   Teodora Medici, [place] 06/28/21 [phone]

## 2021-06-28 NOTE — ED Triage Notes (Signed)
Patient c/o sinus pressure, nasal pressure, headache, no ear pain.  Patient has taken Sudafed, Tylenol, Flonase, Ibuprofen, Saline Nasal Spray.

## 2021-12-30 LAB — HM PAP SMEAR

## 2022-04-21 ENCOUNTER — Ambulatory Visit (HOSPITAL_COMMUNITY): Admit: 2022-04-21 | Payer: 59

## 2022-05-13 DIAGNOSIS — H9202 Otalgia, left ear: Secondary | ICD-10-CM | POA: Insufficient documentation

## 2022-05-13 DIAGNOSIS — M26629 Arthralgia of temporomandibular joint, unspecified side: Secondary | ICD-10-CM | POA: Insufficient documentation

## 2022-05-13 DIAGNOSIS — J069 Acute upper respiratory infection, unspecified: Secondary | ICD-10-CM | POA: Insufficient documentation

## 2022-08-11 ENCOUNTER — Encounter: Payer: Self-pay | Admitting: Internal Medicine

## 2022-08-11 ENCOUNTER — Ambulatory Visit: Payer: 59 | Admitting: Internal Medicine

## 2022-08-11 VITALS — BP 118/70 | HR 83 | Temp 98.9°F | Ht 70.0 in | Wt 202.0 lb

## 2022-08-11 DIAGNOSIS — M5136 Other intervertebral disc degeneration, lumbar region: Secondary | ICD-10-CM

## 2022-08-11 DIAGNOSIS — Z6791 Unspecified blood type, Rh negative: Secondary | ICD-10-CM | POA: Insufficient documentation

## 2022-08-11 DIAGNOSIS — M5412 Radiculopathy, cervical region: Secondary | ICD-10-CM

## 2022-08-11 DIAGNOSIS — F411 Generalized anxiety disorder: Secondary | ICD-10-CM

## 2022-08-11 DIAGNOSIS — K581 Irritable bowel syndrome with constipation: Secondary | ICD-10-CM

## 2022-08-11 DIAGNOSIS — R635 Abnormal weight gain: Secondary | ICD-10-CM | POA: Diagnosis not present

## 2022-08-11 DIAGNOSIS — Z8669 Personal history of other diseases of the nervous system and sense organs: Secondary | ICD-10-CM | POA: Insufficient documentation

## 2022-08-11 DIAGNOSIS — Z0000 Encounter for general adult medical examination without abnormal findings: Secondary | ICD-10-CM | POA: Diagnosis not present

## 2022-08-11 DIAGNOSIS — F410 Panic disorder [episodic paroxysmal anxiety] without agoraphobia: Secondary | ICD-10-CM

## 2022-08-11 LAB — LIPID PANEL
Cholesterol: 174 mg/dL (ref 0–200)
HDL: 44 mg/dL (ref >39.00)
LDL Cholesterol: 101 mg/dL — ABNORMAL HIGH (ref 0–99)
NonHDL: 130.07
Total CHOL/HDL Ratio: 4
Triglycerides: 147 mg/dL (ref 0.0–149.0)
VLDL: 29.4 mg/dL (ref 0.0–40.0)

## 2022-08-11 LAB — COMPREHENSIVE METABOLIC PANEL
ALT: 16 U/L (ref 0–35)
AST: 18 U/L (ref 0–37)
Albumin: 4.7 g/dL (ref 3.5–5.2)
Alkaline Phosphatase: 97 U/L (ref 39–117)
BUN: 13 mg/dL (ref 6–23)
CO2: 29 mEq/L (ref 19–32)
Calcium: 10 mg/dL (ref 8.4–10.5)
Chloride: 102 mEq/L (ref 96–112)
Creatinine, Ser: 0.76 mg/dL (ref 0.40–1.20)
GFR: 102.76 mL/min (ref >60.00)
Glucose, Bld: 96 mg/dL (ref 70–99)
Potassium: 4.7 mEq/L (ref 3.5–5.1)
Sodium: 138 mEq/L (ref 135–145)
Total Bilirubin: 0.4 mg/dL (ref 0.2–1.2)
Total Protein: 7.3 g/dL (ref 6.0–8.3)

## 2022-08-11 LAB — CBC
HCT: 43.9 % (ref 36.0–46.0)
Hemoglobin: 15.1 g/dL — ABNORMAL HIGH (ref 12.0–15.0)
MCHC: 34.5 g/dL (ref 30.0–36.0)
MCV: 84.2 fl (ref 78.0–100.0)
Platelets: 203 10*3/uL (ref 150.0–400.0)
RBC: 5.21 Mil/uL — ABNORMAL HIGH (ref 3.87–5.11)
RDW: 13.4 % (ref 11.5–15.5)
WBC: 5.1 10*3/uL (ref 4.0–10.5)

## 2022-08-11 LAB — TSH: TSH: 2.06 u[IU]/mL (ref 0.35–5.50)

## 2022-08-11 LAB — VITAMIN B12: Vitamin B-12: 642 pg/mL (ref 211–911)

## 2022-08-11 LAB — VITAMIN D 25 HYDROXY (VIT D DEFICIENCY, FRACTURES): VITD: 30.4 ng/mL (ref 30.00–100.00)

## 2022-08-11 MED ORDER — SERTRALINE HCL 100 MG PO TABS: 100.0000 mg | ORAL_TABLET | Freq: Every day | ORAL | 3 refills | Status: DC

## 2022-08-11 NOTE — Progress Notes (Unsigned)
   Subjective:   Patient ID: Norma Weber, female    DOB: [birth year], 32 y.o.   MRN: [id number]  HPI The patient is a new 32 YO female coming in for ongoing care see A/P for details. Also desires physical.  PMH, [place], social history reviewed and updated  Review of Systems  Constitutional:  Positive for unexpected weight change.  HENT: Negative.    Eyes: Negative.   Respiratory:  Negative for cough, chest tightness and shortness of breath.   Cardiovascular:  Negative for chest pain, palpitations and leg swelling.  Gastrointestinal:  Negative for abdominal distention, abdominal pain, constipation, diarrhea, nausea and vomiting.  Musculoskeletal: Negative.   Skin: Negative.   Neurological: Negative.   Psychiatric/Behavioral: Negative.      Objective:  Physical Exam Constitutional:      Appearance: She is well-developed.  HENT:     Head: Normocephalic and atraumatic.  Cardiovascular:     Rate and Rhythm: Normal rate and regular rhythm.  Pulmonary:     Effort: Pulmonary effort is normal. No respiratory distress.     Breath sounds: Normal breath sounds. No wheezing or rales.  Abdominal:     General: Bowel sounds are normal. There is no distension.     Palpations: Abdomen is soft.     Tenderness: There is no abdominal tenderness. There is no rebound.  Musculoskeletal:     Cervical back: Normal range of motion.  Skin:    General: Skin is warm and dry.  Neurological:     Mental Status: She is alert and oriented to person, place, and time.     Coordination: Coordination normal.     Vitals:   08/11/22 0835  BP: 118/70  Pulse: 83  Temp: 98.9 F (37.2 C)  TempSrc: Oral  SpO2: 99%  Weight: 202 lb (91.6 kg)  Height: 5\' 10"  (1.778 m)    Assessment & Plan:

## 2022-08-11 NOTE — Patient Instructions (Addendum)
We will check the labs today. 

## 2022-08-12 DIAGNOSIS — Z0000 Encounter for general adult medical examination without abnormal findings: Secondary | ICD-10-CM | POA: Insufficient documentation

## 2022-08-12 DIAGNOSIS — R635 Abnormal weight gain: Secondary | ICD-10-CM | POA: Insufficient documentation

## 2022-08-12 NOTE — Assessment & Plan Note (Signed)
Seeing psychiatrist currently stable for some years. Sees counselor and desires primary care to manage medications. She has decreased zoloft to 100 mg daily and has done well with this change. Did have several months of mood disturbance with change. Rx done for zoloft 100 mg daily. She also uses alprazolam rare 0.25 mg for panic attacks and still has some remaining. Bottle may be expired okay to refill when needed.

## 2022-08-12 NOTE — Assessment & Plan Note (Signed)
S/P surgery about 1 year ago and this has limited activity in the past few years.

## 2022-08-12 NOTE — Assessment & Plan Note (Signed)
Struggles with this some and working with diet/ water and otc medications as needed. Not due for colonoscopy until 72.

## 2022-08-12 NOTE — Assessment & Plan Note (Signed)
Flu shot declines. Covid-19 counseled. Tetanus up to date. Pap smear up to date with gyn getting records. Counseled about sun safety and mole surveillance. Counseled about the dangers of distracted driving. Given 10 year screening recommendations.

## 2022-08-12 NOTE — Assessment & Plan Note (Signed)
She is working to lose weight to help this over time.

## 2022-08-12 NOTE — Assessment & Plan Note (Signed)
We talked about zoloft as a possible cause of weight gain. She is wanting to make sure vitamins and thyroid are normal to help with her goal of weight loss. She has made dietary changes and has not seen results. Checking CBC, CMP, TSH, vitamin D and B12 and lipid panel. Adjust as needed.

## 2022-09-27 ENCOUNTER — Encounter: Payer: Self-pay | Admitting: Internal Medicine

## 2022-09-28 MED ORDER — ALPRAZOLAM 0.25 MG PO TABS: 0.2500 mg | ORAL_TABLET | Freq: Every day | ORAL | 0 refills | Status: DC | PRN

## 2022-09-28 NOTE — Telephone Encounter (Signed)
Schedule a visit

## 2022-09-28 NOTE — Addendum Note (Signed)
Addended by: Hillard Danker A on: 09/28/2022 09:35 AM   Modules accepted: Orders

## 2023-04-07 ENCOUNTER — Encounter: Payer: Self-pay | Admitting: Internal Medicine

## 2023-04-07 ENCOUNTER — Ambulatory Visit: Payer: 59 | Admitting: Internal Medicine

## 2023-04-07 VITALS — BP 105/72 | HR 72 | Temp 99.1°F | Ht 69.0 in | Wt 204.0 lb

## 2023-04-07 DIAGNOSIS — G43009 Migraine without aura, not intractable, without status migrainosus: Secondary | ICD-10-CM

## 2023-04-07 DIAGNOSIS — R5383 Other fatigue: Secondary | ICD-10-CM | POA: Diagnosis not present

## 2023-04-07 DIAGNOSIS — E669 Obesity, unspecified: Secondary | ICD-10-CM | POA: Insufficient documentation

## 2023-04-07 DIAGNOSIS — F410 Panic disorder [episodic paroxysmal anxiety] without agoraphobia: Secondary | ICD-10-CM

## 2023-04-07 DIAGNOSIS — E66811 Obesity, class 1: Secondary | ICD-10-CM | POA: Diagnosis not present

## 2023-04-07 DIAGNOSIS — Z6830 Body mass index (BMI) 30.0-30.9, adult: Secondary | ICD-10-CM

## 2023-04-07 DIAGNOSIS — F411 Generalized anxiety disorder: Secondary | ICD-10-CM | POA: Diagnosis not present

## 2023-04-07 LAB — FERRITIN: Ferritin: 31.9 ng/mL (ref 10.0–291.0)

## 2023-04-07 LAB — COMPREHENSIVE METABOLIC PANEL
ALT: 15 U/L (ref 0–35)
AST: 16 U/L (ref 0–37)
Albumin: 4.7 g/dL (ref 3.5–5.2)
Alkaline Phosphatase: 98 U/L (ref 39–117)
BUN: 15 mg/dL (ref 6–23)
CO2: 29 meq/L (ref 19–32)
Calcium: 10.1 mg/dL (ref 8.4–10.5)
Chloride: 102 meq/L (ref 96–112)
Creatinine, Ser: 0.81 mg/dL (ref 0.40–1.20)
GFR: 94.76 mL/min (ref >60.00)
Glucose, Bld: 104 mg/dL — ABNORMAL HIGH (ref 70–99)
Potassium: 4.6 meq/L (ref 3.5–5.1)
Sodium: 139 meq/L (ref 135–145)
Total Bilirubin: 0.5 mg/dL (ref 0.2–1.2)
Total Protein: 7.4 g/dL (ref 6.0–8.3)

## 2023-04-07 LAB — TSH: TSH: 2.21 u[IU]/mL (ref 0.35–5.50)

## 2023-04-07 LAB — VITAMIN D 25 HYDROXY (VIT D DEFICIENCY, FRACTURES): VITD: 34.79 ng/mL (ref 30.00–100.00)

## 2023-04-07 LAB — CBC
HCT: 44.8 % (ref 36.0–46.0)
Hemoglobin: 15.2 g/dL — ABNORMAL HIGH (ref 12.0–15.0)
MCHC: 33.8 g/dL (ref 30.0–36.0)
MCV: 87.5 fL (ref 78.0–100.0)
Platelets: 211 10*3/uL (ref 150.0–400.0)
RBC: 5.12 Mil/uL — ABNORMAL HIGH (ref 3.87–5.11)
RDW: 13.3 % (ref 11.5–15.5)
WBC: 5.1 10*3/uL (ref 4.0–10.5)

## 2023-04-07 LAB — VITAMIN B12: Vitamin B-12: 628 pg/mL (ref 211–911)

## 2023-04-07 LAB — HEMOGLOBIN A1C: Hgb A1c MFr Bld: 5.3 % (ref 4.6–6.5)

## 2023-04-07 NOTE — Assessment & Plan Note (Signed)
Checking HgA1c, CBC, CMP, TSH, ferritin, B12, vitamin D and adjust as needed. She is also having weight gain with change in diet.

## 2023-04-07 NOTE — Assessment & Plan Note (Signed)
More migraines lately with dietary changes and intermittent fasting which she is no longer doing. She is having some hormonal changes which may be impacting this.

## 2023-04-07 NOTE — Assessment & Plan Note (Addendum)
Overall stable and taking zoloft 100 mg daily. Some more moodiness which she relates to slight change in cycles and hormones. Continue same dosing.

## 2023-04-07 NOTE — Progress Notes (Signed)
   Subjective:   Patient ID: Norma Weber, female    DOB: 1990/12/03, 32 y.o.   MRN: [id number]  HPI The patient is a 32 YO female coming in for some new lightheadedness and shakiness. She was trying intermittent fasting and this has happening often. Stopped this and still happening. She eats and then within 20 minutes feels better.   Review of Systems  Constitutional:  Positive for fatigue and unexpected weight change.  HENT: Negative.    Eyes: Negative.   Respiratory:  Negative for cough, chest tightness and shortness of breath.   Cardiovascular:  Negative for chest pain, palpitations and leg swelling.  Gastrointestinal:  Negative for abdominal distention, abdominal pain, constipation, diarrhea, nausea and vomiting.  Musculoskeletal: Negative.   Skin: Negative.   Neurological:  Positive for light-headedness.       Shakiness  Psychiatric/Behavioral: Negative.      Objective:  Physical Exam Constitutional:      Appearance: She is well-developed.  HENT:     Head: Normocephalic and atraumatic.  Cardiovascular:     Rate and Rhythm: Normal rate and regular rhythm.  Pulmonary:     Effort: Pulmonary effort is normal. No respiratory distress.     Breath sounds: Normal breath sounds. No wheezing or rales.  Abdominal:     General: Bowel sounds are normal. There is no distension.     Palpations: Abdomen is soft.     Tenderness: There is no abdominal tenderness. There is no rebound.  Musculoskeletal:     Cervical back: Normal range of motion.  Skin:    General: Skin is warm and dry.  Neurological:     Mental Status: She is alert and oriented to person, place, and time.     Coordination: Coordination normal.     Vitals:   04/07/23 0814  BP: 105/72  Pulse: 72  Temp: 99.1 F (37.3 C)  TempSrc: Oral  SpO2: 98%  Weight: 204 lb (92.5 kg)  Height: 5\' 10"  (1.778 m)    Assessment & Plan:

## 2023-04-07 NOTE — Assessment & Plan Note (Signed)
She is interested in wegovy if labs normal. After labs return will prescribe. Counseled about risk and benefit.

## 2023-04-17 ENCOUNTER — Encounter: Payer: Self-pay | Admitting: Internal Medicine

## 2023-04-18 ENCOUNTER — Other Ambulatory Visit (HOSPITAL_COMMUNITY): Payer: Self-pay

## 2023-04-18 MED ORDER — SEMAGLUTIDE-WEIGHT MANAGEMENT 1.7 MG/0.75ML ~~LOC~~ SOAJ: 1.7000 mg | SUBCUTANEOUS | 0 refills | Status: AC

## 2023-04-18 MED ORDER — SEMAGLUTIDE-WEIGHT MANAGEMENT 0.5 MG/0.5ML ~~LOC~~ SOAJ: 0.5000 mg | SUBCUTANEOUS | 0 refills | Status: AC

## 2023-04-18 MED ORDER — SEMAGLUTIDE-WEIGHT MANAGEMENT 2.4 MG/0.75ML ~~LOC~~ SOAJ: 2.4000 mg | SUBCUTANEOUS | 0 refills | Status: AC

## 2023-04-18 MED ORDER — SEMAGLUTIDE-WEIGHT MANAGEMENT 0.25 MG/0.5ML ~~LOC~~ SOAJ: 0.2500 mg | SUBCUTANEOUS | 0 refills | Status: AC

## 2023-04-18 MED ORDER — SEMAGLUTIDE-WEIGHT MANAGEMENT 1 MG/0.5ML ~~LOC~~ SOAJ: 1.0000 mg | SUBCUTANEOUS | 0 refills | Status: AC

## 2023-04-26 ENCOUNTER — Ambulatory Visit: Payer: 59 | Admitting: Podiatry

## 2023-04-26 ENCOUNTER — Encounter: Payer: Self-pay | Admitting: Podiatry

## 2023-04-26 ENCOUNTER — Ambulatory Visit (INDEPENDENT_AMBULATORY_CARE_PROVIDER_SITE_OTHER): Payer: 59

## 2023-04-26 DIAGNOSIS — M778 Other enthesopathies, not elsewhere classified: Secondary | ICD-10-CM

## 2023-04-26 DIAGNOSIS — M7671 Peroneal tendinitis, right leg: Secondary | ICD-10-CM | POA: Diagnosis not present

## 2023-04-26 NOTE — Patient Instructions (Signed)
Peroneal Tendinopathy Rehab Ask your health care provider which exercises are safe for you. Do exercises exactly as told by your health care provider and adjust them as directed. It is normal to feel mild stretching, pulling, tightness, or discomfort as you do these exercises. Stop right away if you feel sudden pain or your pain gets worse. Do not begin these exercises until told by your health care provider. Stretching and range-of-motion exercises These exercises warm up your muscles and joints. They can help improve the movement and flexibility of your ankle. They may also help to relieve pain and stiffness. Gastrocnemius and soleus stretch, standing This is an exercise in which you stand on a step and use your body weight to stretch your calf muscles. To do this exercise: Stand on the edge of a step on the ball of your left / right foot. The ball of your foot is on the walking surface, right under your toes. Keep your other foot firmly on the same step. Hold on to the wall, a railing, or a chair for balance. Slowly lift your other foot, allowing your body weight to press your left / right heel down over the edge of the step. You should feel a stretch in your left / right calf (gastrocnemius and soleus). Hold this position for __________ seconds. Return both feet to the step. Repeat this exercise with a slight bend in your left / right knee. Repeat __________ times with your left / right knee straight and __________ times with your left / right knee bent. Complete this exercise __________ times a day. Strengthening exercises These exercises build strength and endurance in your foot and ankle. Endurance is the ability to use your muscles for a long time, even after they get tired. Ankle dorsiflexion with band  Secure a rubber exercise band or tube to an object, such as a table leg, that will not move when the band is pulled. Secure the other end of the band around your left / right foot. Sit on  the floor. Face the object with your left / right leg extended. The band or tube should be slightly tense when your foot is relaxed. Slowly flex your left / right ankle and toes to bring your foot toward you (dorsiflexion). Hold this position for __________ seconds. Let the band or tube slowly pull your foot back to the starting position. Repeat __________ times. Complete this exercise __________ times a day. Ankle eversion  Sit on the floor with your legs straight out in front of you. Loop a rubber exercise band or tube around the ball of your left / right foot. The ball of your foot is on the walking surface, right under your toes. Hold the ends of the band in your hands. You can also secure the band to a stable object. The band or tube should be slightly tense when your foot is relaxed. Slowly push your foot outward, away from your other leg (eversion). Hold this position for __________ seconds. Slowly return your foot to the starting position. Repeat __________ times. Complete this exercise __________ times a day. Plantar flexion, standing This exercise is sometimes called a standing heel raise. Stand with your feet shoulder-width apart. Place your hands on a wall or table to steady yourself as needed. Try not to use it for support. Keep your weight spread evenly over the width of your feet while you slowly rise up on your toes (plantar flexion). If told by your health care provider: Shift your weight   toward your left / right leg until you feel challenged. Stand on your left / right leg only. Hold this position for __________ seconds. Repeat __________ times. Complete this exercise __________ times a day. Single leg stand  Without shoes, stand near a railing or in a doorway. You may hold on to the railing or doorframe as needed. Stand on your left / right foot. Keep your big toe down on the floor and try to keep your arch lifted. Do not roll to the outside of your foot. If this  exercise is too easy, you can try it with your eyes closed or while standing on a pillow. Hold this position for __________ seconds. Repeat __________ times. Complete this exercise __________ times a day. This information is not intended to replace advice given to you by your health care provider. Make sure you discuss any questions you have with your health care provider. Document Revised: 08/27/2021 Document Reviewed: 08/27/2021 Elsevier Patient Education  2024 Elsevier Inc.  

## 2023-04-28 MED ORDER — MELOXICAM 7.5 MG PO TABS: 7.5000 mg | ORAL_TABLET | Freq: Every day | ORAL | 0 refills | Status: DC | PRN

## 2023-04-28 NOTE — Progress Notes (Signed)
Subjective:   Patient ID: Norma Weber, female   DOB: 32 y.o.   MRN: [id number]   HPI Chief Complaint  Patient presents with   Foot Pain    RM#13 Right sided lateral foot pain. Goes down just before the pinky toe and up and around the ankle. Lots of clicking. Frequent walking, especially on incline, makes it feel like it's on fire. Also hurts to bear weight on it first thing in the morning. Ongoing for 6 months and getting worse.   32 year old female presents the office with above concerns.  She has pain to the lateral aspect of the foot, ankle.  She points on the fifth metatarsal base area where she gets discomfort as well.  No other concerns today.   Review of Systems  All other systems reviewed and are negative.  Past Medical History:  Diagnosis Date   Anxiety    GERD (gastroesophageal reflux disease)    History of kidney stones    Migraine     Past Surgical History:  Procedure Laterality Date   HERNIA REPAIR     NECK SURGERY       Current Outpatient Medications:    acetaminophen (TYLENOL) 500 MG tablet, Take 1,000 mg by mouth every 6 (six) hours as needed for mild pain., Disp: , Rfl:    ALPRAZolam (XANAX) 0.25 MG tablet, Take 1 tablet (0.25 mg total) by mouth daily as needed., Disp: 10 tablet, Rfl: 0   cetirizine (ZYRTEC) 10 MG tablet, Take 1 tablet by mouth daily., Disp: , Rfl:    cyclobenzaprine (FLEXERIL) 10 MG tablet, Take 1 tablet by mouth as needed., Disp: , Rfl:    famotidine (PEPCID) 20 MG tablet, Take 20 mg by mouth daily., Disp: , Rfl:    hydrocortisone 2.5 % cream, Apply topically., Disp: , Rfl:    ibuprofen (ADVIL) 800 MG tablet, Take 800 mg by mouth as needed., Disp: , Rfl:    Magnesium 250 MG TABS, Take 250 mg by mouth daily at 2 am., Disp: , Rfl:    Melatonin 5 MG CHEW, Chew 5 mg by mouth at bedtime., Disp: , Rfl:    Prenatal Vit-Fe Sulfate-FA-DHA (PRENATAL VITAMIN/MIN +DHA) 27-0.8-200 MG CAPS, , Disp: , Rfl:    promethazine (PHENERGAN) 25 MG tablet,  Take 25 mg by mouth every 6 (six) hours as needed for nausea or vomiting., Disp: , Rfl:    Semaglutide-Weight Management 0.25 MG/0.5ML SOAJ, Inject 0.25 mg into the skin once a week for 28 days., Disp: 2 mL, Rfl: 0   [START ON 05/17/2023] Semaglutide-Weight Management 0.5 MG/0.5ML SOAJ, Inject 0.5 mg into the skin once a week for 28 days., Disp: 2 mL, Rfl: 0   [START ON 06/15/2023] Semaglutide-Weight Management 1 MG/0.5ML SOAJ, Inject 1 mg into the skin once a week for 28 days., Disp: 2 mL, Rfl: 0   [START ON 07/14/2023] Semaglutide-Weight Management 1.7 MG/0.75ML SOAJ, Inject 1.7 mg into the skin once a week for 28 days., Disp: 3 mL, Rfl: 0   [START ON 08/12/2023] Semaglutide-Weight Management 2.4 MG/0.75ML SOAJ, Inject 2.4 mg into the skin once a week for 28 days., Disp: 3 mL, Rfl: 0   sertraline (ZOLOFT) 100 MG tablet, Take 1 tablet (100 mg total) by mouth daily., Disp: 90 tablet, Rfl: 3  Allergies  Allergen Reactions   Wound Dressings Itching           Objective:  Physical Exam  General: AAO x3, NAD  Dermatological: Skin is warm, dry and supple  bilateral. . There are no open sores, no preulcerative lesions, no rash or signs of infection present.  Vascular: Dorsalis Pedis artery and Posterior Tibial artery pedal pulses are 2/4 bilateral with immedate capillary fill time.  There is no pain with calf compression, swelling, warmth, erythema.   Neruologic: Grossly intact via light touch bilateral.    Musculoskeletal: Tenderness along the course of the peroneal tendon posterior to the inferior to lateral malleolus towards the fifth metatarsal base and on the fifth metatarsal base.  There is no area pinpoint tenderness identified at this time.  Anterior drawer test appears to be stable compared to contralateral extremity.  There is no pinpoint tenderness on the tibia, fibula or other areas of the foot.  MMT pressure is 5. Gait: Unassisted, Nonantalgic.       Assessment:   Peroneal  tendinitis right side     Plan:  -Treatment options discussed including all alternatives, risks, and complications -Etiology of symptoms were discussed -X-rays were obtained and reviewed with the patient.  Multiple views right foot were obtained.  No evidence of acute fracture noted today. -Discussed short burst of prednisone order start with a course of mobic as needed.  -Icing daily. -Trilock ankle brace dispensed for short-term immobilization to help facilitate soft tissue healing. -Discussed stretching, rehab exercises which were provided today. -Shoes, good arch support. -If symptoms persist recommend MRI  Return in about 6 weeks (around 06/07/2023).  Vivi Barrack DPM

## 2023-04-29 ENCOUNTER — Telehealth: Payer: Self-pay

## 2023-04-29 ENCOUNTER — Other Ambulatory Visit (HOSPITAL_COMMUNITY): Payer: Self-pay

## 2023-04-29 NOTE — Telephone Encounter (Signed)
Pharmacy Patient Advocate Encounter   Received notification from CoverMyMeds that prior authorization for Wegovy 0.5MG /0.5ML auto-injectors is required/requested.   Insurance verification completed.   The patient is insured through [facility] MEDICAID .   Per test claim: Drug is only covered for CV risk, not weight loss

## 2023-05-02 NOTE — Telephone Encounter (Signed)
Noted  

## 2023-05-02 NOTE — Telephone Encounter (Signed)
Patient stated this was denied. Im wanting to confirm

## 2023-05-03 ENCOUNTER — Other Ambulatory Visit (HOSPITAL_COMMUNITY): Payer: Self-pay

## 2023-05-03 NOTE — Telephone Encounter (Signed)
Pharmacy Patient Advocate Encounter  Received notification from [facility] that Prior Authorization for [facility] 0.5MG /0.5ML has been DENIED.  See denial reason below. No denial letter attached in CMM. Will attach denial letter to Media tab once received.     PA #/Case ID/Reference #: [id number]

## 2023-06-24 ENCOUNTER — Other Ambulatory Visit (HOSPITAL_COMMUNITY): Payer: Self-pay

## 2023-07-23 ENCOUNTER — Encounter (HOSPITAL_COMMUNITY): Payer: Self-pay | Admitting: *Deleted

## 2023-07-23 ENCOUNTER — Inpatient Hospital Stay (HOSPITAL_COMMUNITY)
Admission: AD | Admit: 2023-07-23 | Discharge: 2023-07-23 | Disposition: A | Discharge status: Home / Self Care | Attending: Obstetrics and Gynecology | Admitting: Obstetrics and Gynecology

## 2023-07-23 DIAGNOSIS — Z3A01 Less than 8 weeks gestation of pregnancy: Secondary | ICD-10-CM | POA: Insufficient documentation

## 2023-07-23 DIAGNOSIS — O209 Hemorrhage in early pregnancy, unspecified: Secondary | ICD-10-CM | POA: Diagnosis present

## 2023-07-23 DIAGNOSIS — Z3A00 Weeks of gestation of pregnancy not specified: Secondary | ICD-10-CM | POA: Diagnosis not present

## 2023-07-23 HISTORY — DX: Unspecified abnormal cytological findings in specimens from vagina: R87.629

## 2023-07-23 HISTORY — DX: Gestational (pregnancy-induced) hypertension without significant proteinuria, unspecified trimester: O13.9

## 2023-07-23 LAB — HCG, QUANTITATIVE, PREGNANCY: hCG, Beta Chain, Quant, S: 19 m[IU]/mL — ABNORMAL HIGH (ref <5)

## 2023-07-23 LAB — POCT PREGNANCY, URINE: Preg Test, Ur: NEGATIVE

## 2023-07-23 NOTE — MAU Provider Note (Signed)
 Event Date/Time   First Provider Initiated Contact with Patient 07/23/23 1048      [name] is a 33 y.o. G1P1001 patient who presents to MAU today with complaint of possible miscarriage. She states she had a positive pregnancy test on Tuesday. She woke up today and passed one clot. Reports pink on the tissue when she wipes. She denies any pain. She reports she had a fever this morning but no cough, congestion or shortness of breath. She has not been seen anywhere this pregnancy but has her first appointment scheduled for the end of the month.   O BP 135/79 (BP Location: Right Arm)   Pulse 87   Temp 99.1 F (37.3 C) (Oral)   Resp 17   Ht 5\' 10"  (1.778 m)   Wt 92.1 kg   LMP 06/21/2023   SpO2 100%   BMI 29.14 kg/m  Physical Exam Vitals and nursing note reviewed.  Constitutional:      General: She is not in acute distress.    Appearance: She is well-developed.  HENT:     Head: Normocephalic.  Eyes:     Pupils: Pupils are equal, round, and reactive to light.  Cardiovascular:     Rate and Rhythm: Normal rate and regular rhythm.     Heart sounds: Normal heart sounds.  Pulmonary:     Effort: Pulmonary effort is normal. No respiratory distress.     Breath sounds: Normal breath sounds.  Abdominal:     General: Bowel sounds are normal. There is no distension.     Palpations: Abdomen is soft.     Tenderness: There is no abdominal tenderness.  Skin:    General: Skin is warm and dry.  Neurological:     Mental Status: She is alert and oriented to person, place, and time.  Psychiatric:        Mood and Affect: Mood normal.        Behavior: Behavior normal.        Thought Content: Thought content normal.        Judgment: Judgment normal.    Results for orders placed or performed during the hospital encounter of 07/23/23 (from the past 24 hours)  Pregnancy, urine POC     Status: None   Collection Time: 07/23/23 10:45 AM  Result Value Ref Range   Preg Test, Ur NEGATIVE  NEGATIVE  hCG, quantitative, pregnancy     Status: Abnormal   Collection Time: 07/23/23 10:57 AM  Result Value Ref Range   hCG, Beta Chain, Quant, S 19 (H) <5 mIU/mL    A Medical screening exam complete 1. Bleeding in early pregnancy    -Negative UPT in MAU. HCG ordered.  -Called patient to review HCG of 19. Reviewed possible causes and recommendation for repeat HCG on Monday. Patient prefers to follow up with OB on Monday.   P -Discharge home in stable condition -First trimester precautions discussed -Patient advised to follow-up with OB on Monday -Patient may return to MAU as needed or if her condition were to change or worsen   Rolm Bookbinder, [place] 07/23/2023 10:48 AM

## 2023-07-23 NOTE — MAU Note (Signed)
 Norma Weber is a 33 y.o. at Unknown here in MAU reporting: +HPT on Tues.  Has not been confirmed, first appt was for 7 wks.  This morning when she got up, she saw a lot of blood when she wiped.  Has since seen when she wiped, 'maybe a tablespoon in toilet'. No clots.  Little pain on rlq, kind of crampy past couple days. Fever 101.0, did not take anything.( No real symptoms or issues, did not realize she had fever, no SOB, cough or sorethroat) Nauseous, ongoing past wk. Thought things would calm down or pass, and she could be seen in office on Monday, but when she checked her temp, she thought she should come in, since that was one of the things they mentioned.  LMP: 2/4 Onset of complaint: bleeding started this morning.  Pain score: 1 Vitals:   07/23/23 1025  BP: 135/79  Pulse: 87  Resp: 17  Temp: 99.1 F (37.3 C)  SpO2: 100%     Lab orders placed from triage:  UPT

## 2023-10-02 ENCOUNTER — Other Ambulatory Visit: Payer: Self-pay | Admitting: Internal Medicine

## 2023-11-04 ENCOUNTER — Other Ambulatory Visit: Payer: Self-pay | Admitting: Internal Medicine

## 2023-11-18 ENCOUNTER — Ambulatory Visit (INDEPENDENT_AMBULATORY_CARE_PROVIDER_SITE_OTHER)

## 2023-11-18 ENCOUNTER — Ambulatory Visit
Admission: EM | Admit: 2023-11-18 | Discharge: 2023-11-18 | Disposition: A | Discharge status: Home / Self Care | Attending: Family Medicine | Admitting: Family Medicine

## 2023-11-18 DIAGNOSIS — J069 Acute upper respiratory infection, unspecified: Secondary | ICD-10-CM | POA: Diagnosis not present

## 2023-11-18 DIAGNOSIS — R0602 Shortness of breath: Secondary | ICD-10-CM

## 2023-11-18 DIAGNOSIS — R051 Acute cough: Secondary | ICD-10-CM | POA: Insufficient documentation

## 2023-11-18 DIAGNOSIS — R070 Pain in throat: Secondary | ICD-10-CM | POA: Diagnosis not present

## 2023-11-18 DIAGNOSIS — J9801 Acute bronchospasm: Secondary | ICD-10-CM

## 2023-11-18 LAB — POC SARS CORONAVIRUS 2 AG -  ED: SARS Coronavirus 2 Ag: NEGATIVE

## 2023-11-18 LAB — POCT RAPID STREP A (OFFICE): Rapid Strep A Screen: NEGATIVE

## 2023-11-18 MED ORDER — ALBUTEROL SULFATE HFA 108 (90 BASE) MCG/ACT IN AERS: 2.0000 | INHALATION_SPRAY | RESPIRATORY_TRACT | 0 refills | Status: DC | PRN

## 2023-11-18 MED ORDER — PREDNISONE 20 MG PO TABS: 40.0000 mg | ORAL_TABLET | Freq: Every day | ORAL | 0 refills | Status: AC

## 2023-11-18 MED ORDER — BENZONATATE 100 MG PO CAPS: 100.0000 mg | ORAL_CAPSULE | Freq: Three times a day (TID) | ORAL | 0 refills | Status: DC | PRN

## 2023-11-18 NOTE — ED Triage Notes (Signed)
 This sore throat started about 2 nights ago, some dry cough now with ear's bother me the last day or two, my chest is getting sore from the cough. No fever.

## 2023-11-18 NOTE — ED Provider Notes (Signed)
 [facility]    CSN: [id number] Arrival date & time: 11/18/23  1040      History   Chief Complaint Chief Complaint  Patient presents with   Sore Throat    HPI Norma Weber is a 33 y.o. female.    Sore Throat  Here for sore throat.  She has had a dry cough since June 30.  Then she developed a sore throat.  Night before last.  She has had some clear nasal drainage.  No fever or chills.  This morning she started bringing up mucus with her cough and has felt short of breath some.  No history of asthma.  Her daughter had similar symptoms and got tested for strep on June 30.  NKDA  Last menstrual cycle was June 21.  Past Medical History:  Diagnosis Date   Anxiety 2007   GERD (gastroesophageal reflux disease) 2008   History of kidney stones    Migraine    Pregnancy induced hypertension    Vaginal Pap smear, abnormal     Patient Active Problem List   Diagnosis Date Noted   Other fatigue 04/07/2023   Obesity 04/07/2023   Routine general medical examination at a health care facility 08/12/2022   Weight gain 08/12/2022   Laryngopharyngeal reflux (LPR) 05/20/2020   Pain in right foot 12/28/2019   Cervical radiculopathy 07/07/2018   Degeneration of lumbar intervertebral disc 06/23/2017   Generalized anxiety disorder with panic attacks 06/07/2017   Abnormal Pap smear of cervix 03/25/2015   Irritable bowel syndrome with constipation 03/25/2015   Migraine 03/25/2015    Past Surgical History:  Procedure Laterality Date   HERNIA REPAIR  1990   Newborn   NECK SURGERY     SPINE SURGERY  Oct 05, 2021   C4/C5 disc replacement    OB History     Gravida  1   Para  1   Term  1   Preterm      AB      Living  1      SAB      IAB      Ectopic      Multiple  0   Live Births  1            Home Medications    Prior to Admission medications   Medication Sig Start Date End Date Taking? Authorizing Provider  albuterol  (VENTOLIN  HFA)  108 (90 Base) MCG/ACT inhaler Inhale 2 puffs into the lungs every 4 (four) hours as needed for wheezing or shortness of breath. 11/18/23  Yes Vonna Sharlet POUR, MD  benzonatate  (TESSALON ) 100 MG capsule Take 1 capsule (100 mg total) by mouth 3 (three) times daily as needed for cough. 11/18/23  Yes Teran Daughenbaugh K, MD  famotidine (PEPCID) 20 MG tablet Take 20 mg by mouth daily. 05/17/22  Yes [provider]  loratadine (CLARITIN) 10 MG tablet Take 10 mg by mouth daily.   Yes [provider]  predniSONE  (DELTASONE ) 20 MG tablet Take 2 tablets (40 mg total) by mouth daily with breakfast for 5 days. 11/18/23 11/23/23 Yes Vonna Sharlet POUR, MD  sertraline  (ZOLOFT ) 100 MG tablet Take 1 tablet by mouth once daily 11/07/23  Yes Rollene Almarie LABOR, MD  acetaminophen  (TYLENOL ) 500 MG tablet Take 1,000 mg by mouth every 6 (six) hours as needed for mild pain.    [provider]  ALPRAZolam  (XANAX ) 0.25 MG tablet Take 1 tablet (0.25 mg total) by mouth daily  as needed. 09/28/22   Rollene Almarie LABOR, MD  cetirizine (ZYRTEC) 10 MG tablet Take 1 tablet by mouth daily.    [provider]  cyclobenzaprine (FLEXERIL) 10 MG tablet Take 1 tablet by mouth as needed. 10/05/21   [provider]  hydrocortisone 2.5 % cream Apply topically. 05/30/20   [provider]  Magnesium 250 MG TABS Take 250 mg by mouth daily at 2 am. 07/15/21   [provider]  Melatonin 5 MG CHEW Chew 5 mg by mouth at bedtime. 05/17/21   [provider]  Prenatal Vit-Fe Sulfate-FA-DHA (PRENATAL VITAMIN/MIN +DHA) 27-0.8-200 MG CAPS  05/17/22   [provider]    Family History Family History  Problem Relation Age of Onset   Depression Mother    Arthritis Mother    Migraines Mother    Anxiety disorder Mother    Depression Father    Cancer Maternal Grandmother    Diabetes Paternal Grandmother    Diabetes Paternal Aunt     Social History Social History   Tobacco Use    Smoking status: Never   Smokeless tobacco: Never  Vaping Use   Vaping status: Never Used  Substance Use Topics   Alcohol use: Not Currently   Drug use: No     Allergies   Wound dressings   Review of Systems Review of Systems   Physical Exam Triage Vital Signs ED Triage Vitals  Encounter Vitals Group     BP 11/18/23 1053 115/77     Girls Systolic BP Percentile --      Girls Diastolic BP Percentile --      Boys Systolic BP Percentile --      Boys Diastolic BP Percentile --      Pulse Rate 11/18/23 1053 90     Resp 11/18/23 1053 18     Temp 11/18/23 1053 98.8 F (37.1 C)     Temp Source 11/18/23 1053 Oral     SpO2 11/18/23 1053 96 %     Weight 11/18/23 1050 203 lb 0.7 oz (92.1 kg)     Height 11/18/23 1050 5' 10 (1.778 m)     Head Circumference --      Peak Flow --      Pain Score 11/18/23 1049 0     Pain Loc --      Pain Education --      Exclude from Growth Chart --    No data found.  Updated Vital Signs BP 115/77 (BP Location: Left Arm)   Pulse 90   Temp 98.8 F (37.1 C) (Oral)   Resp 18   Ht 5' 10 (1.778 m)   Wt 92.1 kg   LMP 11/05/2023 (Exact Date)   SpO2 96%   BMI 29.13 kg/m   Visual Acuity Right Eye Distance:   Left Eye Distance:   Bilateral Distance:    Right Eye Near:   Left Eye Near:    Bilateral Near:     Physical Exam Vitals reviewed.  Constitutional:      General: She is not in acute distress.    Appearance: She is not ill-appearing, toxic-appearing or diaphoretic.  HENT:     Right Ear: Tympanic membrane and ear canal normal.     Left Ear: Tympanic membrane and ear canal normal.     Nose: Congestion present.     Mouth/Throat:     Mouth: Mucous membranes are moist.     Comments: There is intense erythema of the posterior oropharynx.  Uvula is in the midline and there is no asymmetry. Eyes:     Extraocular Movements: Extraocular movements intact.     Conjunctiva/sclera: Conjunctivae normal.     Pupils: Pupils are equal, round,  and reactive to light.  Cardiovascular:     Rate and Rhythm: Normal rate and regular rhythm.     Heart sounds: No murmur heard. Pulmonary:     Effort: No respiratory distress.     Breath sounds: No stridor. No rhonchi or rales.     Comments: No wheezing heard and no rhonchi, but the expiratory phase is prolonged. Musculoskeletal:     Cervical back: Neck supple.  Lymphadenopathy:     Cervical: No cervical adenopathy.  Skin:    Capillary Refill: Capillary refill takes less than 2 seconds.     Coloration: Skin is not jaundiced or pale.  Neurological:     General: No focal deficit present.     Mental Status: She is alert and oriented to person, place, and time.  Psychiatric:        Behavior: Behavior normal.      UC Treatments / Results  Labs (all labs ordered are listed, but only abnormal results are displayed) Labs Reviewed  CULTURE, GROUP A STREP [facility])  POC SARS CORONAVIRUS 2 AG -  ED  POCT RAPID STREP A (OFFICE)    EKG   Radiology DG Chest 2 View Result Date: 11/18/2023 CLINICAL DATA:  Shortness of breath. EXAM: CHEST - 2 VIEW COMPARISON:  01/06/2004. FINDINGS: The heart size and mediastinal contours are within normal limits. Both lungs are clear. The visualized skeletal structures are unremarkable. IMPRESSION: No active cardiopulmonary disease. Electronically Signed   By: Camellia Candle M.D.   On: 11/18/2023 11:32    Procedures Procedures (including critical care time)  Medications Ordered in UC Medications - No data to display  Initial Impression / Assessment and Plan / UC Course  I have reviewed the triage vital signs and the nursing notes.  Pertinent labs & imaging results that were available during my care of the patient were reviewed by me and considered in my medical decision making (see chart for details).     Strep test and COVID test are negative.  Throat culture is sent and we will notify her if that is in turn positive and treat per protocol.  Chest  x-ray is also read as negative.  Her expiratory phase was little prolonged, so I am going to send in prednisone  and Tessalon  Perles.  Also an albuterol  inhaler will be sent in for her to try. Final Clinical Impressions(s) / UC Diagnoses   Final diagnoses:  Acute cough  Shortness of breath  Throat pain  Viral URI  Bronchospasm     Discharge Instructions      The COVID test was negative  Your strep test is negative.  Culture of the throat will be sent, and staff will notify you if that is in turn positive.  Your chest x-ray is read and does not show any pneumonia or fluid.  Albuterol  inhaler--do 2 puffs every 4 hours as needed for shortness of breath or wheezing since it took extra time for the air to flow out of your lungs, I am concerned that you still might be having some bronchospasm.  Please try using this inhaler to see if it will give you any relief.  Take prednisone  20 mg--2 daily for 5 days; this would help any inflammation in your lungs and will also help with inflammation  in your throat.  Take benzonatate  100 mg, 1 tab every 8 hours as needed for cough.  You can also use Cepacol spray as needed.  You can take NyQuil/DayQuil for your symptoms also.     ED Prescriptions     Medication Sig Dispense Auth. Provider   benzonatate  (TESSALON ) 100 MG capsule Take 1 capsule (100 mg total) by mouth 3 (three) times daily as needed for cough. 21 capsule Vonna Sharlet POUR, MD   predniSONE  (DELTASONE ) 20 MG tablet Take 2 tablets (40 mg total) by mouth daily with breakfast for 5 days. 10 tablet Vonna Sharlet POUR, MD   albuterol  (VENTOLIN  HFA) 108 (90 Base) MCG/ACT inhaler Inhale 2 puffs into the lungs every 4 (four) hours as needed for wheezing or shortness of breath. 1 each Vonna Sharlet POUR, MD      PDMP not reviewed this encounter.   Vonna Sharlet POUR, MD 11/18/23 1146

## 2023-11-18 NOTE — Discharge Instructions (Signed)
 The COVID test was negative  Your strep test is negative.  Culture of the throat will be sent, and staff will notify you if that is in turn positive.  Your chest x-ray is read and does not show any pneumonia or fluid.  Albuterol  inhaler--do 2 puffs every 4 hours as needed for shortness of breath or wheezing since it took extra time for the air to flow out of your lungs, I am concerned that you still might be having some bronchospasm.  Please try using this inhaler to see if it will give you any relief.  Take prednisone  20 mg--2 daily for 5 days; this would help any inflammation in your lungs and will also help with inflammation in your throat.  Take benzonatate  100 mg, 1 tab every 8 hours as needed for cough.  You can also use Cepacol spray as needed.  You can take NyQuil/DayQuil for your symptoms also.

## 2023-11-22 ENCOUNTER — Ambulatory Visit (HOSPITAL_COMMUNITY): Payer: Self-pay

## 2023-11-22 ENCOUNTER — Telehealth: Admitting: Family Medicine

## 2023-11-22 DIAGNOSIS — B9689 Other specified bacterial agents as the cause of diseases classified elsewhere: Secondary | ICD-10-CM

## 2023-11-22 DIAGNOSIS — J019 Acute sinusitis, unspecified: Secondary | ICD-10-CM

## 2023-11-22 LAB — CULTURE, GROUP A STREP (THRC)

## 2023-11-22 MED ORDER — AMOXICILLIN-POT CLAVULANATE 875-125 MG PO TABS: 1.0000 | ORAL_TABLET | Freq: Two times a day (BID) | ORAL | 0 refills | Status: AC

## 2023-11-22 NOTE — Progress Notes (Signed)

## 2023-12-13 ENCOUNTER — Encounter: Payer: Self-pay | Admitting: Internal Medicine

## 2023-12-13 ENCOUNTER — Ambulatory Visit (INDEPENDENT_AMBULATORY_CARE_PROVIDER_SITE_OTHER): Admitting: Internal Medicine

## 2023-12-13 VITALS — BP 126/80 | HR 65 | Temp 98.4°F | Ht 70.0 in | Wt 210.0 lb

## 2023-12-13 DIAGNOSIS — R058 Other specified cough: Secondary | ICD-10-CM | POA: Insufficient documentation

## 2023-12-13 MED ORDER — FLUTICASONE PROPIONATE 50 MCG/ACT NA SUSP: 2.0000 | Freq: Every day | NASAL | 6 refills | Status: AC

## 2023-12-13 MED ORDER — PROMETHAZINE-DM 6.25-15 MG/5ML PO SYRP
5.0000 mL | ORAL_SOLUTION | Freq: Four times a day (QID) | ORAL | 0 refills | Status: DC | PRN
Start: 2023-12-13 — End: 2024-02-10

## 2023-12-13 MED ORDER — BENZONATATE 100 MG PO CAPS: 100.0000 mg | ORAL_CAPSULE | Freq: Three times a day (TID) | ORAL | 0 refills | Status: DC | PRN

## 2023-12-13 NOTE — Assessment & Plan Note (Signed)
 Cough ongoing for about 3-4 weeks. Rx flonase  for post nasal drip and continue zyrtec. Add promethazine /dm for night time and benzonatate  during the day which is refilled. Lungs are clear and no signs of infection on exam.

## 2023-12-13 NOTE — Patient Instructions (Signed)
 WE have sent in the flonase  to use 2 sprays each onostril at night time.  We have sent in the benzonatate  perles to use for cough and promethazine /dm for cough at night time.

## 2023-12-13 NOTE — Progress Notes (Signed)
   Subjective:   Patient ID: Norma Weber, female    DOB: 1991/01/31, 33 y.o.   MRN: [id number]  HPI The patient is a 33 YO female coming in for lingering cough. Treated for sinus infection early July with 1 week course of antibiotics. She is still coughing at night time and with talking. Taking zytec and sudafed which helps some. Denies fevers or chills.   Review of Systems  Constitutional: Negative.   HENT:  Positive for congestion, ear pain and postnasal drip.   Eyes: Negative.   Respiratory:  Positive for cough. Negative for chest tightness and shortness of breath.   Cardiovascular:  Negative for chest pain, palpitations and leg swelling.  Gastrointestinal:  Negative for abdominal distention, abdominal pain, constipation, diarrhea, nausea and vomiting.  Musculoskeletal: Negative.   Skin: Negative.   Neurological: Negative.   Psychiatric/Behavioral: Negative.      Objective:  Physical Exam Constitutional:      Appearance: She is well-developed.  HENT:     Head: Normocephalic and atraumatic.     Comments: Oropharynx with redness and clear drainage, nose with swollen turbinates, TMs normal bilaterally.     Right Ear: Tympanic membrane normal.     Left Ear: Tympanic membrane normal.  Neck:     Thyroid : No thyromegaly.  Cardiovascular:     Rate and Rhythm: Normal rate and regular rhythm.  Pulmonary:     Effort: Pulmonary effort is normal. No respiratory distress.     Breath sounds: Normal breath sounds. No wheezing or rales.  Abdominal:     General: Bowel sounds are normal. There is no distension.     Palpations: Abdomen is soft.     Tenderness: There is no abdominal tenderness. There is no rebound.  Musculoskeletal:     Cervical back: Normal range of motion.  Lymphadenopathy:     Cervical: No cervical adenopathy.  Skin:    General: Skin is warm and dry.  Neurological:     Mental Status: She is alert and oriented to person, place, and time.     Coordination:  Coordination normal.     Vitals:   12/13/23 0906  BP: 126/80  Pulse: 65  Temp: 98.4 F (36.9 C)  TempSrc: Oral  SpO2: 97%  Weight: 210 lb (95.3 kg)  Height: 5' 10 (1.778 m)    Assessment & Plan:

## 2024-02-10 ENCOUNTER — Ambulatory Visit: Payer: Self-pay | Admitting: Internal Medicine

## 2024-02-10 ENCOUNTER — Ambulatory Visit: Admitting: Internal Medicine

## 2024-02-10 ENCOUNTER — Encounter: Payer: Self-pay | Admitting: Internal Medicine

## 2024-02-10 VITALS — BP 116/80 | HR 76 | Temp 98.8°F | Ht 70.0 in | Wt 204.0 lb

## 2024-02-10 DIAGNOSIS — G8929 Other chronic pain: Secondary | ICD-10-CM

## 2024-02-10 DIAGNOSIS — R1031 Right lower quadrant pain: Secondary | ICD-10-CM | POA: Diagnosis not present

## 2024-02-10 DIAGNOSIS — R110 Nausea: Secondary | ICD-10-CM

## 2024-02-10 DIAGNOSIS — R194 Change in bowel habit: Secondary | ICD-10-CM

## 2024-02-10 LAB — COMPREHENSIVE METABOLIC PANEL WITH GFR
ALT: 16 U/L (ref 0–35)
AST: 18 U/L (ref 0–37)
Albumin: 4.6 g/dL (ref 3.5–5.2)
Alkaline Phosphatase: 92 U/L (ref 39–117)
BUN: 15 mg/dL (ref 6–23)
CO2: 30 meq/L (ref 19–32)
Calcium: 9.9 mg/dL (ref 8.4–10.5)
Chloride: 103 meq/L (ref 96–112)
Creatinine, Ser: 0.77 mg/dL (ref 0.40–1.20)
GFR: 100.1 mL/min (ref >60.00)
Glucose, Bld: 98 mg/dL (ref 70–99)
Potassium: 4.4 meq/L (ref 3.5–5.1)
Sodium: 139 meq/L (ref 135–145)
Total Bilirubin: 0.4 mg/dL (ref 0.2–1.2)
Total Protein: 7.2 g/dL (ref 6.0–8.3)

## 2024-02-10 LAB — CBC
HCT: 42.9 % (ref 36.0–46.0)
Hemoglobin: 14.6 g/dL (ref 12.0–15.0)
MCHC: 34 g/dL (ref 30.0–36.0)
MCV: 86 fl (ref 78.0–100.0)
Platelets: 196 K/uL (ref 150.0–400.0)
RBC: 4.99 Mil/uL (ref 3.87–5.11)
RDW: 13 % (ref 11.5–15.5)
WBC: 5 K/uL (ref 4.0–10.5)

## 2024-02-10 LAB — LIPASE: Lipase: 22 U/L (ref 11.0–59.0)

## 2024-02-10 NOTE — Assessment & Plan Note (Signed)
 She has experienced chronic gastrointestinal symptoms, including diarrhea, urgency, and mucus, for 2-3 weeks. The differential diagnosis includes gallbladder issues and bowel inflammation. Previous evaluations for UTI and ovarian issues were negative. Order blood work to check for inflammation markers, including WBC count. If no significant inflammation is found, perform an abdominal ultrasound to evaluate the liver and gallbladder. If inflammation is present, consider a CT scan to assess bowel inflammation and abdominal pathology. Discussed the risks of CT scan radiation and dye exposure, particularly concerning infertility and potential pregnancy. Coordinate the CT scan timing with her menstrual cycle to minimize radiation risk during potential early pregnancy.

## 2024-02-10 NOTE — Progress Notes (Signed)
 Subjective:   Patient ID: Norma Weber, female    DOB: Oct 27, 1990, 33 y.o.   MRN: [id number]  Discussed the use of AI scribe software for clinical note transcription with the patient, who gave verbal consent to proceed.  History of Present Illness Norma Weber is a 33 year old female who presents with gastrointestinal symptoms and nausea.  She has been experiencing lower right-sided abdominal pain, nausea, and changes in bowel habits. These symptoms began during a discussion about fertility issues with her OB during an annual pap smear. The nausea occurs randomly throughout the day, accompanied by a suppressed appetite and lower right-sided abdominal pain.  She was treated for a urinary tract infection after blood was found in her urine, confirmed by a culture. An ultrasound of the bladder and ovaries was normal, and a kidney stone was considered but not confirmed.  There has been a significant change in her bowel habits over the past two to three weeks, with increased frequency and urgency, particularly after consuming fatty or fried foods. She experiences episodes of diarrhea, sometimes with mucus, and a sensation of needing to defecate with little result. Typically, she is constipated, making this change notable. She recalls a colonoscopy performed approximately 18 years ago, which was unremarkable.  She has been experiencing back pain, which has worsened recently, along with hip pain. She has a history of back pain, which she manages regularly. She attempted lifestyle changes in July, including increased water intake, healthier eating habits, and exercise, but had to reduce physical activity due to the onset of abdominal pain.  No specific food triggers for her symptoms are identified, although fatty and fried foods exacerbate her gastrointestinal issues.     Review of Systems  Constitutional: Negative.   HENT: Negative.    Eyes: Negative.   Respiratory:  Negative for cough,  chest tightness and shortness of breath.   Cardiovascular:  Negative for chest pain, palpitations and leg swelling.  Gastrointestinal:  Positive for abdominal pain and nausea. Negative for abdominal distention, constipation, diarrhea and vomiting.       Change in bowel habits  Musculoskeletal: Negative.   Skin: Negative.   Neurological: Negative.   Psychiatric/Behavioral: Negative.      Objective:  Physical Exam Constitutional:      Appearance: She is well-developed.  HENT:     Head: Normocephalic and atraumatic.  Cardiovascular:     Rate and Rhythm: Normal rate and regular rhythm.  Pulmonary:     Effort: Pulmonary effort is normal. No respiratory distress.     Breath sounds: Normal breath sounds. No wheezing or rales.  Abdominal:     General: Bowel sounds are normal. There is no distension.     Palpations: Abdomen is soft.     Tenderness: There is no abdominal tenderness.  Musculoskeletal:     Cervical back: Normal range of motion.  Skin:    General: Skin is warm and dry.  Neurological:     Mental Status: She is alert and oriented to person, place, and time.     Coordination: Coordination normal.     Vitals:   02/10/24 0906  BP: 116/80  Pulse: 76  Temp: 98.8 F (37.1 C)  TempSrc: Oral  SpO2: 98%  Weight: 204 lb (92.5 kg)  Height: 5' 10 (1.778 m)    Assessment and Plan Assessment & Plan Chronic gastrointestinal symptoms with diarrhea, urgency, and mucus  as well as nausea She has experienced chronic gastrointestinal symptoms, including diarrhea, urgency, and  mucus, for 2-3 weeks. The differential diagnosis includes gallbladder issues and bowel inflammation. Previous evaluations for UTI and ovarian issues were negative. Order blood work to check for inflammation markers, including WBC count. If no significant inflammation is found, perform an abdominal ultrasound to evaluate the liver and gallbladder. If inflammation is present, consider a CT scan to assess bowel  inflammation and abdominal pathology. Discussed the risks of CT scan radiation and dye exposure, particularly concerning infertility and potential pregnancy. Coordinate the CT scan timing with her menstrual cycle to minimize radiation risk during potential early pregnancy.

## 2024-02-14 ENCOUNTER — Ambulatory Visit
Admission: RE | Admit: 2024-02-14 | Discharge: 2024-02-14 | Disposition: A | Discharge status: Home / Self Care | Source: Ambulatory Visit | Attending: Internal Medicine | Admitting: Internal Medicine

## 2024-02-14 DIAGNOSIS — R110 Nausea: Secondary | ICD-10-CM

## 2024-02-14 DIAGNOSIS — G8929 Other chronic pain: Secondary | ICD-10-CM

## 2024-02-16 ENCOUNTER — Ambulatory Visit: Payer: Self-pay | Admitting: Internal Medicine

## 2024-02-17 MED ORDER — PANTOPRAZOLE SODIUM 40 MG PO TBEC
40.0000 mg | DELAYED_RELEASE_TABLET | Freq: Every day | ORAL | 0 refills | Status: DC
Start: 2024-02-17 — End: 2024-03-20

## 2024-02-24 ENCOUNTER — Other Ambulatory Visit: Payer: Self-pay | Admitting: Internal Medicine

## 2024-03-20 ENCOUNTER — Other Ambulatory Visit: Payer: Self-pay | Admitting: Internal Medicine

## 2024-03-20 ENCOUNTER — Encounter: Payer: Self-pay | Admitting: Internal Medicine

## 2024-03-21 MED ORDER — PANTOPRAZOLE SODIUM 40 MG PO TBEC
40.0000 mg | DELAYED_RELEASE_TABLET | Freq: Every day | ORAL | 0 refills | Status: AC
Start: 2024-03-21 — End: ?

## 2024-04-06 ENCOUNTER — Other Ambulatory Visit: Payer: Self-pay | Admitting: Internal Medicine

## 2024-04-24 NOTE — Therapy (Signed)
 OUTPATIENT PHYSICAL THERAPY CERVICAL EVALUATION   Patient Name: Norma Weber MRN: [id number] DOB:Sep 27, 1990, 33 y.o., female Today's Date: 04/24/2024  END OF SESSION:   Past Medical History:  Diagnosis Date   Anxiety 2007   GERD (gastroesophageal reflux disease) 2008   History of kidney stones    Migraine    Pregnancy induced hypertension    Vaginal Pap smear, abnormal    Past Surgical History:  Procedure Laterality Date   HERNIA REPAIR  1990   Newborn   NECK SURGERY     SPINE SURGERY  Oct 05, 2021   C4/C5 disc replacement   Patient Active Problem List   Diagnosis Date Noted   Change in bowel habits 02/10/2024   Chronic RLQ pain 02/10/2024   Nausea 02/10/2024   Post-viral cough syndrome 12/13/2023   Other fatigue 04/07/2023   Obesity 04/07/2023   Routine general medical examination at a health care facility 08/12/2022   Weight gain 08/12/2022   Laryngopharyngeal reflux (LPR) 05/20/2020   Pain in right foot 12/28/2019   Cervical radiculopathy 07/07/2018   Degeneration of lumbar intervertebral disc 06/23/2017   Generalized anxiety disorder with panic attacks 06/07/2017   Abnormal Pap smear of cervix 03/25/2015   Irritable bowel syndrome with constipation 03/25/2015   Migraine 03/25/2015    PCP:    Rollene Almarie LABOR, MD    REFERRING PROVIDER: Jennetta Gerard BIRCH, NP   REFERRING DIAG: M50.10 (ICD-10-CM) - Cervical disc disorder with radiculopathy, unspecified cervical region   THERAPY DIAG:  No diagnosis found.  Rationale for Evaluation and Treatment: Rehabilitation  ONSET DATE: ***  SUBJECTIVE:                                                                                                                                                                                                         SUBJECTIVE STATEMENT: *** Hand dominance: {MISC; OT HAND DOMINANCE:[phone]}  PERTINENT HISTORY:  ***  PAIN:  Are you having pain?  {OPRCPAIN:27236}  PRECAUTIONS: {Therapy precautions:24002}  RED FLAGS: {PT Red Flags:29287}     WEIGHT BEARING RESTRICTIONS: {Yes ***/No:24003}  FALLS:  Has patient fallen in last 6 months? {fallsyesno:27318}  LIVING ENVIRONMENT: Lives with: {[facility] lives with:25569::lives with their family} Lives in: {Lives in:25570} Stairs: {opstairs:27293} Has following equipment at home: {Assistive devices:23999}  OCCUPATION: ***  PLOF: {PLOF:24004}  PATIENT GOALS: ***  NEXT MD VISIT: ***  OBJECTIVE:  Note: Objective measures were completed at Evaluation unless otherwise noted.  DIAGNOSTIC FINDINGS:    PATIENT SURVEYS:  {rehab surveys:24030}  COGNITION: Overall cognitive status: {cognition:24006}  SENSATION: {  sensation:27233}  POSTURE: {posture:25561}  PALPATION: ***   CERVICAL ROM:   {AROM/PROM:27142} ROM A/PROM (deg) eval  Flexion   Extension   Right lateral flexion   Left lateral flexion   Right rotation   Left rotation    (Blank rows = not tested)  UPPER EXTREMITY ROM:  {AROM/PROM:27142} ROM Right eval Left eval  Shoulder flexion    Shoulder extension    Shoulder abduction    Shoulder adduction    Shoulder extension    Shoulder internal rotation    Shoulder external rotation    Elbow flexion    Elbow extension    Wrist flexion    Wrist extension    Wrist ulnar deviation    Wrist radial deviation    Wrist pronation    Wrist supination     (Blank rows = not tested)  UPPER EXTREMITY MMT:  MMT Right eval Left eval  Shoulder flexion    Shoulder extension    Shoulder abduction    Shoulder adduction    Shoulder extension    Shoulder internal rotation    Shoulder external rotation    Middle trapezius    Lower trapezius    Elbow flexion    Elbow extension    Wrist flexion    Wrist extension    Wrist ulnar deviation    Wrist radial deviation    Wrist pronation    Wrist supination    Grip strength     (Blank rows = not  tested)  CERVICAL SPECIAL TESTS:  {Cervical special tests:25246}  FUNCTIONAL TESTS:  {Functional tests:24029}  TREATMENT DATE:  [facility] Adult PT Treatment:                                                DATE: 04/25/24 Therapeutic Exercise: *** Manual Therapy: *** Neuromuscular re-ed: *** Therapeutic Activity: *** Modalities: *** Self Care: ***                                                                                                                                PATIENT EDUCATION:  Education details: *** Person educated: {Person educated:25204} Education method: {Education Method:25205} Education comprehension: {Education Comprehension:25206}  HOME EXERCISE PROGRAM: ***  ASSESSMENT:  CLINICAL IMPRESSION: Patient is a 33 y.o. female who was seen today for physical therapy evaluation and treatment for M50.10 (ICD-10-CM) - Cervical disc disorder with radiculopathy, unspecified cervical region .      OBJECTIVE IMPAIRMENTS: {opptimpairments:25111}.   ACTIVITY LIMITATIONS: {activitylimitations:27494}  PARTICIPATION LIMITATIONS: {participationrestrictions:25113}  PERSONAL FACTORS: {Personal factors:25162} are also affecting patient's functional outcome.   REHAB POTENTIAL: {rehabpotential:25112}  CLINICAL DECISION MAKING: {clinical decision making:25114}  EVALUATION COMPLEXITY: {Evaluation complexity:25115}   GOALS:  SHORT TERM GOALS: Target date: ***  Pt will be Ind in an initial HEP  Baseline:  Goal status: INITIAL  2.  *** Baseline:  Goal status: INITIAL  3.  *** Baseline:  Goal status: INITIAL  4.  *** Baseline:  Goal status: INITIAL  5.  *** Baseline:  Goal status: INITIAL  6.  *** Baseline:  Goal status: INITIAL  LONG TERM GOALS: Target date: ***  Pt will be Ind in a final HEP to maintain achieved LOF  Baseline:  Goal status: INITIAL  2.  *** Baseline:  Goal status: INITIAL  3.  *** Baseline:  Goal status: INITIAL  4.   *** Baseline:  Goal status: INITIAL  5.  *** Baseline:  Goal status: INITIAL  6.  *** Baseline:  Goal status: INITIAL   PLAN:  PT FREQUENCY: {rehab frequency:25116}  PT DURATION: {rehab duration:25117}  PLANNED INTERVENTIONS: {rehab planned interventions:25118::97110-Therapeutic exercises,97530- Therapeutic [phone]- Neuromuscular re-education,97535- Self Rjmz,02859- Manual therapy,Patient/Family education}  PLAN FOR NEXT SESSION: ***   Kadarious Dikes, PT 04/24/2024, 9:26 AM

## 2024-04-25 ENCOUNTER — Other Ambulatory Visit: Payer: Self-pay

## 2024-04-25 ENCOUNTER — Ambulatory Visit: Attending: Student

## 2024-04-25 DIAGNOSIS — R292 Abnormal reflex: Secondary | ICD-10-CM

## 2024-04-25 DIAGNOSIS — R252 Cramp and spasm: Secondary | ICD-10-CM

## 2024-04-25 DIAGNOSIS — M542 Cervicalgia: Secondary | ICD-10-CM

## 2024-05-03 ENCOUNTER — Telehealth: Admitting: Family Medicine

## 2024-05-03 DIAGNOSIS — N3000 Acute cystitis without hematuria: Secondary | ICD-10-CM | POA: Diagnosis not present

## 2024-05-03 MED ORDER — CEPHALEXIN 500 MG PO CAPS: 500.0000 mg | ORAL_CAPSULE | Freq: Two times a day (BID) | ORAL | 0 refills | Status: AC

## 2024-05-03 NOTE — Progress Notes (Signed)

## 2024-05-15 NOTE — Therapy (Signed)
 " OUTPATIENT PHYSICAL THERAPY CERVICAL TREATMENT   Patient Name: Norma Weber MRN: [id number] DOB:10-25-1990, 33 y.o., female Today's Date: 05/16/2024  END OF SESSION:  PT End of Session - 05/16/24 1030     Visit Number 2    Number of Visits 9    Date for Recertification  06/29/24    Authorization Type [employer] OTHER    PT Start Time [phone]    PT Stop Time 1020    PT Time Calculation (min) 45 min    Activity Tolerance Patient tolerated treatment well    Behavior During Therapy [facility] for tasks assessed/performed           Past Medical History:  Diagnosis Date   Anxiety 2007   GERD (gastroesophageal reflux disease) 2008   History of kidney stones    Migraine    Pregnancy induced hypertension    Vaginal Pap smear, abnormal    Past Surgical History:  Procedure Laterality Date   HERNIA REPAIR  1990   Newborn   NECK SURGERY     SPINE SURGERY  Oct 05, 2021   C4/C5 disc replacement   Patient Active Problem List   Diagnosis Date Noted   Change in bowel habits 02/10/2024   Chronic RLQ pain 02/10/2024   Nausea 02/10/2024   Post-viral cough syndrome 12/13/2023   Other fatigue 04/07/2023   Obesity 04/07/2023   Routine general medical examination at a health care facility 08/12/2022   Weight gain 08/12/2022   Laryngopharyngeal reflux (LPR) 05/20/2020   Pain in right foot 12/28/2019   Cervical radiculopathy 07/07/2018   Degeneration of lumbar intervertebral disc 06/23/2017   Generalized anxiety disorder with panic attacks 06/07/2017   Abnormal Pap smear of cervix 03/25/2015   Irritable bowel syndrome with constipation 03/25/2015   Migraine 03/25/2015    PCP:    Rollene Almarie LABOR, MD    REFERRING PROVIDER: Jennetta Gerard BIRCH, NP   REFERRING DIAG: M50.10 (ICD-10-CM) - Cervical disc disorder with radiculopathy, unspecified cervical region   THERAPY DIAG:  Cervicalgia  Abnormal postural reflex  Cramp and spasm  Rationale for Evaluation and  Treatment: Rehabilitation  ONSET DATE: 3-4 weeks ago  SUBJECTIVE:                                                                                                                                                                                                         SUBJECTIVE STATEMENT: Pt reports both shoulders are tight. Pt has been doing the stretches, but not the Tband exs due to her having a fall and  hurting her R elbow last week.  EVAL: Pt reports experiencing an significant increase in her baseline L cervical, upper shoulder, mid back pain 3-4 weeks ago which is none feeling better than it was. She still notes her baseline pain is elevated and would like to see if it could be decreased.  Hand dominance: Right  PERTINENT HISTORY:  Hx of C4-C5 disc replacement 5/23  PAIN:  Are you having pain? Yes: NPRS scale: Range 2,3-7/10; Currently: 4/10 Pain location: neck, upper shoulders and mis back, L greater than R, HA-back of head and forehead Pain description: ache  Aggravating factors: A lot of physical activitiy, repetitive lifting of 4.5 yo daughter (40#) Relieving factors: Medications, heat, ice, TENS, massage usually 1x per month, massage ball  PRECAUTIONS: None  RED FLAGS: None    WEIGHT BEARING RESTRICTIONS: No  FALLS:  Has patient fallen in last 6 months? No  LIVING ENVIRONMENT: Lives with: lives with their family Lives in: House/apartment Able to access home  OCCUPATION: Office type work on computer   PLOF: Independent  PATIENT GOALS: To decrease pain closer to her baseline, to return to [employer] strengthening in her own gym    OBJECTIVE:  Note: Objective measures were completed at Evaluation unless otherwise noted.  DIAGNOSTIC FINDINGS:    PATIENT SURVEYS:  NDI: 13/50=26%  Minimum Detectable Change (90% confidence): 5 points or 10% points  COGNITION: Overall cognitive status: Within functional limits for tasks assessed  SENSATION: WFL  POSTURE:  rounded shoulders and forward head  PALPATION: TTP to the upper traps, levators, suboccipitals, L>R   CERVICAL ROM:   Active ROM A/PROM (deg) eval  Flexion 35 tight  Extension 60 no p  Right lateral flexion 40 tight L  Left lateral flexion 35 tight R, catch L  Right rotation 60 tight L  Left rotation 50 tight R   (Blank rows = not tested)  UPPER EXTREMITY ROM: WFLs Active ROM Right eval Left eval  Shoulder flexion    Shoulder extension    Shoulder abduction    Shoulder adduction    Shoulder extension    Shoulder internal rotation    Shoulder external rotation    Elbow flexion    Elbow extension    Wrist flexion    Wrist extension    Wrist ulnar deviation    Wrist radial deviation    Wrist pronation    Wrist supination     (Blank rows = not tested)  UPPER EXTREMITY MMT: Myotome screen negative and equal MMT Right eval Left eval  Shoulder flexion    Shoulder extension    Shoulder abduction    Shoulder adduction    Shoulder extension    Shoulder internal rotation    Shoulder external rotation    Middle trapezius    Lower trapezius    Elbow flexion    Elbow extension    Wrist flexion    Wrist extension    Wrist ulnar deviation    Wrist radial deviation    Wrist pronation    Wrist supination    Grip strength 75 79   (Blank rows = not tested)  CERVICAL SPECIAL TESTS:  Spurling's test: Negative  TREATMENT DATE:  [facility] Adult PT Treatment:                                                DATE:  05/16/24 Manual Therapy: STM the upper shoulder with TPR to the upper traps and levators bilat Cervical traction Suboccipital release UPAs grade 2-3 C2-C6 Modalities: Trigger Point Dry Needling Initial Treatment: Pt instructed on Dry Needling rational, procedures, and possible side effects. Pt instructed to expect mild to moderate muscle soreness later in the day and/or into the next day.  Pt instructed in methods to reduce muscle soreness. Pt instructed to  continue prescribed HEP. Because Dry Needling was performed over or adjacent to a lung field, pt was educated on S/S of pneumothorax and to seek immediate medical attention should they occur.  Patient was educated on signs and symptoms of infection and other risk factors and advised to seek medical attention should they occur.  Patient verbalized understanding of these instructions and education.  Patient Verbal Consent Given: Yes Education Handout Provided: Yes Muscles Treated: Bilat upper traps and levator Electrical Stimulation Performed: No Treatment Response/Outcome: muscle twitch response. A drop of blood c 1 needle which was cleansed c alcohol swab with no additional bleeding. Pt experienced some light headedness which resolved after sitting up for a few mins, drinking cold water, and a cool, damp wash was placed clothe on her neck   [facility] Adult PT Treatment:                                                DATE: 04/25/24 Therapeutic Exercise: Developed, instructed in, and pt completed therex as noted in HEP  Self Care: Eval finding and purpose for PT Proper computer set, use of theracane, safety tips for driving with limited neck mobility                                                                                                                                PATIENT EDUCATION:  Education details: Eval findings, POC, HEP, self care  Person educated: Patient Education method: Explanation, Demonstration, Tactile cues, and Verbal cues Education comprehension: verbalized understanding, returned demonstration, verbal cues required, and tactile cues required  HOME EXERCISE PROGRAM: Access Code: [email or web address] URL: [email or web address] Date: 04/25/2024 Prepared by: Dasie Daft  Exercises - Supine Chin Tucks on Flat Ball  - 1 x daily - 7 x weekly - 1 sets - 10 reps - 3 hold - Supine DNF Liftoffs  - 1 x daily - 7 x weekly - 1 sets - 5 reps - 5 hold - Shoulder External  Rotation and Scapular Retraction with Resistance  - 1 x daily - 7 x weekly - 3 sets - 10 reps - 3 hold - Standing Shoulder Horizontal Abduction with Resistance  - 1 x daily - 7 x weekly - 3 sets - 5 reps - 3 hold - Seated Scalenes Stretch  - 1 x daily - 7 x weekly - 1 sets - 3 reps - 10-15  hold  ASSESSMENT:  CLINICAL IMPRESSION: PT was provided for manual therapy and TPDN as documented above. Muscle twitch responses were elicited. Pt is to continue with her HEP and is to progress to posterior chain Tband strengthening as the soreness for her fall last week improves. Will assess pt's response to the TPDN her next PT appt.  EVAL: Patient is a 33 y.o. female who was seen today for physical therapy evaluation and treatment for M50.10 (ICD-10-CM) - Cervical disc disorder with radiculopathy, unspecified cervical region. Pt presents with the following deficits: pain, postural dysfunction, decreased postural strength, and increased upper shoulder muscle tightness L>R. A HEP as initiated to address deficits. Pt will benefit from skilled PT to address impairments to optimize neck function with less pain.     OBJECTIVE IMPAIRMENTS: decreased ROM, decreased strength, increased muscle spasms, impaired flexibility, postural dysfunction, and pain.   ACTIVITY LIMITATIONS: carrying, lifting, sitting, sleeping, and caring for others  PARTICIPATION LIMITATIONS: meal prep, cleaning, laundry, driving, shopping, community activity, and occupation  PERSONAL FACTORS: Past/current experiences are also affecting patient's functional outcome.   REHAB POTENTIAL: Good  CLINICAL DECISION MAKING: Evolving/moderate complexity  EVALUATION COMPLEXITY: Moderate   GOALS:  SHORT TERM GOALS: Target date: 05/18/24  Pt will be Ind in an initial HEP  Baseline: started Goal status: INITIAL  2.  Pt will report 25% or greater improvement in her pain for improved neck function and QOL. Baseline:  Goal status: INITIAL  LONG  TERM GOALS: Target date: 06/30/23  Pt will be Ind in a final HEP to maintain achieved LOF  Baseline:  Goal status: INITIAL  2.  Pt will report 75% or greater improvement in her pain for improved neck function and QOL, esp with lifting and caring for her daughter. Baseline:  Goal status: INITIAL  3.  Pt will demonstrate improved L cervical SB and rotation to be equal to the R for improved function esp c driving Baseline: see flow sheets Goal status: INITIAL  4.  Pt's NDI score will improve by the MCID to 16%% or better as indication of improved function  Baseline: 26% Goal status: INITIAL   PLAN:  PT FREQUENCY: 1x/week  PT DURATION: 8 weeks  PLANNED INTERVENTIONS: 97164- PT Re-evaluation, 97110-Therapeutic exercises, 97530- Therapeutic activity, 97112- Neuromuscular re-education, 97535- Self Care, 02859- Manual therapy, G0283- Electrical stimulation (unattended), 20560 (1-2 muscles), 20561 (3+ muscles)- Dry Needling, Patient/Family education, Taping, Joint mobilization, Spinal mobilization, Cryotherapy, and Moist heat  PLAN FOR NEXT SESSION: Assess response to HEP; progress therex as indicated; use of modalities, manual therapy; and TPDN as indicated.    Kaedin Hicklin MS, PT 05/16/2024 10:50 AM       "

## 2024-05-16 ENCOUNTER — Ambulatory Visit

## 2024-05-16 DIAGNOSIS — R252 Cramp and spasm: Secondary | ICD-10-CM

## 2024-05-16 DIAGNOSIS — M542 Cervicalgia: Secondary | ICD-10-CM

## 2024-05-16 DIAGNOSIS — R292 Abnormal reflex: Secondary | ICD-10-CM

## 2024-05-16 NOTE — Patient Instructions (Signed)

## 2024-05-22 NOTE — Therapy (Incomplete)
 " OUTPATIENT PHYSICAL THERAPY CERVICAL TREATMENT   Patient Name: Norma Weber MRN: [id number] DOB:[birth year], 34 y.o., female Today's Date: 05/22/2024  END OF SESSION:     Past Medical History:  Diagnosis Date   Anxiety 2007   GERD (gastroesophageal reflux disease) 2008   History of kidney stones    Migraine    Pregnancy induced hypertension    Vaginal Pap smear, abnormal    Past Surgical History:  Procedure Laterality Date   HERNIA REPAIR  1990   Newborn   NECK SURGERY     SPINE SURGERY  Oct 05, 2021   C4/C5 disc replacement   Patient Active Problem List   Diagnosis Date Noted   Change in bowel habits 02/10/2024   Chronic RLQ pain 02/10/2024   Nausea 02/10/2024   Post-viral cough syndrome 12/13/2023   Other fatigue 04/07/2023   Obesity 04/07/2023   Routine general medical examination at a health care facility 08/12/2022   Weight gain 08/12/2022   Laryngopharyngeal reflux (LPR) 05/20/2020   Pain in right foot 12/28/2019   Cervical radiculopathy 07/07/2018   Degeneration of lumbar intervertebral disc 06/23/2017   Generalized anxiety disorder with panic attacks 06/07/2017   Abnormal Pap smear of cervix 03/25/2015   Irritable bowel syndrome with constipation 03/25/2015   Migraine 03/25/2015    PCP:    Rollene Almarie LABOR, MD    REFERRING PROVIDER: Jennetta Gerard BIRCH, NP   REFERRING DIAG: M50.10 (ICD-10-CM) - Cervical disc disorder with radiculopathy, unspecified cervical region   THERAPY DIAG:  No diagnosis found.  Rationale for Evaluation and Treatment: Rehabilitation  ONSET DATE: 3-4 weeks ago  SUBJECTIVE:                                                                                                                                                                                                         SUBJECTIVE STATEMENT: Pt reports both shoulders are tight. Pt has been doing the stretches, but not the Tband exs due to her having a fall and  hurting her R elbow last week.  EVAL: Pt reports experiencing an significant increase in her baseline L cervical, upper shoulder, mid back pain 3-4 weeks ago which is none feeling better than it was. She still notes her baseline pain is elevated and would like to see if it could be decreased.  Hand dominance: Right  PERTINENT HISTORY:  Hx of C4-C5 disc replacement 5/23  PAIN:  Are you having pain? Yes: NPRS scale: Range 2,3-7/10; Currently: 4/10 Pain location: neck, upper shoulders and mis back, L greater than  R, HA-back of head and forehead Pain description: ache  Aggravating factors: A lot of physical activitiy, repetitive lifting of 4.5 yo daughter (40#) Relieving factors: Medications, heat, ice, TENS, massage usually 1x per month, massage ball  PRECAUTIONS: None  RED FLAGS: None    WEIGHT BEARING RESTRICTIONS: No  FALLS:  Has patient fallen in last 6 months? No  LIVING ENVIRONMENT: Lives with: lives with their family Lives in: House/apartment Able to access home  OCCUPATION: Office type work on computer   PLOF: Independent  PATIENT GOALS: To decrease pain closer to her baseline, to return to [employer] strengthening in her own gym    OBJECTIVE:  Note: Objective measures were completed at Evaluation unless otherwise noted.  DIAGNOSTIC FINDINGS:    PATIENT SURVEYS:  NDI: 13/50=26%  Minimum Detectable Change (90% confidence): 5 points or 10% points  COGNITION: Overall cognitive status: Within functional limits for tasks assessed  SENSATION: WFL  POSTURE: rounded shoulders and forward head  PALPATION: TTP to the upper traps, levators, suboccipitals, L>R   CERVICAL ROM:   Active ROM A/PROM (deg) eval  Flexion 35 tight  Extension 60 no p  Right lateral flexion 40 tight L  Left lateral flexion 35 tight R, catch L  Right rotation 60 tight L  Left rotation 50 tight R   (Blank rows = not tested)  UPPER EXTREMITY ROM: WFLs Active ROM Right eval Left eval   Shoulder flexion    Shoulder extension    Shoulder abduction    Shoulder adduction    Shoulder extension    Shoulder internal rotation    Shoulder external rotation    Elbow flexion    Elbow extension    Wrist flexion    Wrist extension    Wrist ulnar deviation    Wrist radial deviation    Wrist pronation    Wrist supination     (Blank rows = not tested)  UPPER EXTREMITY MMT: Myotome screen negative and equal MMT Right eval Left eval  Shoulder flexion    Shoulder extension    Shoulder abduction    Shoulder adduction    Shoulder extension    Shoulder internal rotation    Shoulder external rotation    Middle trapezius    Lower trapezius    Elbow flexion    Elbow extension    Wrist flexion    Wrist extension    Wrist ulnar deviation    Wrist radial deviation    Wrist pronation    Wrist supination    Grip strength 75 79   (Blank rows = not tested)  CERVICAL SPECIAL TESTS:  Spurling's test: Negative  TREATMENT DATE:  [facility] Adult PT Treatment:                                                DATE: 05/24/24 Manual Therapy: STM the upper shoulder with TPR to the upper traps and levators bilat Cervical traction Suboccipital release UPAs grade 2-3 C2-C6 Modalities: Trigger Point Dry Needling Initial Treatment: Pt instructed on Dry Needling rational, procedures, and possible side effects. Pt instructed to expect mild to moderate muscle soreness later in the day and/or into the next day.  Pt instructed in methods to reduce muscle soreness. Pt instructed to continue prescribed HEP. Because Dry Needling was performed over or adjacent to a lung field, pt was educated on S/S  of pneumothorax and to seek immediate medical attention should they occur.  Patient was educated on signs and symptoms of infection and other risk factors and advised to seek medical attention should they occur.  Patient verbalized understanding of these instructions and education.  Patient Verbal  Consent Given: Yes Education Handout Provided: Yes Muscles Treated: Bilat upper traps and levator Electrical Stimulation Performed: No Treatment Response/Outcome: muscle twitch response. A drop of blood c 1 needle which was cleansed c alcohol swab with no additional bleeding. Pt experienced some light headedness which resolved after sitting up for a few mins, drinking cold water, and a cool, damp wash was placed clothe on her neck Therapeutic Exercise: *** Manual Therapy: *** Neuromuscular re-ed: *** Therapeutic Activity: *** Modalities: *** Self Care: ***  [facility] Adult PT Treatment:                                                DATE: 05/16/24 Manual Therapy: STM the upper shoulder with TPR to the upper traps and levators bilat Cervical traction Suboccipital release UPAs grade 2-3 C2-C6 Modalities: Trigger Point Dry Needling Initial Treatment: Pt instructed on Dry Needling rational, procedures, and possible side effects. Pt instructed to expect mild to moderate muscle soreness later in the day and/or into the next day.  Pt instructed in methods to reduce muscle soreness. Pt instructed to continue prescribed HEP. Because Dry Needling was performed over or adjacent to a lung field, pt was educated on S/S of pneumothorax and to seek immediate medical attention should they occur.  Patient was educated on signs and symptoms of infection and other risk factors and advised to seek medical attention should they occur.  Patient verbalized understanding of these instructions and education.  Patient Verbal Consent Given: Yes Education Handout Provided: Yes Muscles Treated: Bilat upper traps and levator Electrical Stimulation Performed: No Treatment Response/Outcome: muscle twitch response. A drop of blood c 1 needle which was cleansed c alcohol swab with no additional bleeding. Pt experienced some light headedness which resolved after sitting up for a few mins, drinking cold water, and a  cool, damp wash was placed clothe on her neck   [facility] Adult PT Treatment:                                                DATE: 04/25/24 Therapeutic Exercise: Developed, instructed in, and pt completed therex as noted in HEP  Self Care: Eval finding and purpose for PT Proper computer set, use of theracane, safety tips for driving with limited neck mobility  PATIENT EDUCATION:  Education details: Eval findings, POC, HEP, self care  Person educated: Patient Education method: Explanation, Demonstration, Tactile cues, and Verbal cues Education comprehension: verbalized understanding, returned demonstration, verbal cues required, and tactile cues required  HOME EXERCISE PROGRAM: Access Code: [email or web address] URL: [email or web address] Date: 04/25/2024 Prepared by: Dasie Daft  Exercises - Supine Chin Tucks on Flat Ball  - 1 x daily - 7 x weekly - 1 sets - 10 reps - 3 hold - Supine DNF Liftoffs  - 1 x daily - 7 x weekly - 1 sets - 5 reps - 5 hold - Shoulder External Rotation and Scapular Retraction with Resistance  - 1 x daily - 7 x weekly - 3 sets - 10 reps - 3 hold - Standing Shoulder Horizontal Abduction with Resistance  - 1 x daily - 7 x weekly - 3 sets - 5 reps - 3 hold - Seated Scalenes Stretch  - 1 x daily - 7 x weekly - 1 sets - 3 reps - 10-15 hold  ASSESSMENT:  CLINICAL IMPRESSION: PT was provided for manual therapy and TPDN as documented above. Muscle twitch responses were elicited. Pt is to continue with her HEP and is to progress to posterior chain Tband strengthening as the soreness for her fall last week improves. Will assess pt's response to the TPDN her next PT appt.  EVAL: Patient is a 34 y.o. female who was seen today for physical therapy evaluation and treatment for M50.10 (ICD-10-CM) - Cervical disc disorder with radiculopathy, unspecified  cervical region. Pt presents with the following deficits: pain, postural dysfunction, decreased postural strength, and increased upper shoulder muscle tightness L>R. A HEP as initiated to address deficits. Pt will benefit from skilled PT to address impairments to optimize neck function with less pain.     OBJECTIVE IMPAIRMENTS: decreased ROM, decreased strength, increased muscle spasms, impaired flexibility, postural dysfunction, and pain.   ACTIVITY LIMITATIONS: carrying, lifting, sitting, sleeping, and caring for others  PARTICIPATION LIMITATIONS: meal prep, cleaning, laundry, driving, shopping, community activity, and occupation  PERSONAL FACTORS: Past/current experiences are also affecting patient's functional outcome.   REHAB POTENTIAL: Good  CLINICAL DECISION MAKING: Evolving/moderate complexity  EVALUATION COMPLEXITY: Moderate   GOALS:  SHORT TERM GOALS: Target date: 05/18/24  Pt will be Ind in an initial HEP  Baseline: started Goal status: INITIAL  2.  Pt will report 25% or greater improvement in her pain for improved neck function and QOL. Baseline:  Goal status: INITIAL  LONG TERM GOALS: Target date: 06/30/23  Pt will be Ind in a final HEP to maintain achieved LOF  Baseline:  Goal status: INITIAL  2.  Pt will report 75% or greater improvement in her pain for improved neck function and QOL, esp with lifting and caring for her daughter. Baseline:  Goal status: INITIAL  3.  Pt will demonstrate improved L cervical SB and rotation to be equal to the R for improved function esp c driving Baseline: see flow sheets Goal status: INITIAL  4.  Pt's NDI score will improve by the MCID to 16%% or better as indication of improved function  Baseline: 26% Goal status: INITIAL   PLAN:  PT FREQUENCY: 1x/week  PT DURATION: 8 weeks  PLANNED INTERVENTIONS: 97164- PT Re-evaluation, 97110-Therapeutic exercises, 97530- Therapeutic activity, 97112- Neuromuscular re-education,  97535- Self Care, 02859- Manual therapy, G0283- Electrical stimulation (unattended), 20560 (1-2 muscles), 20561 (3+ muscles)- Dry Needling, Patient/Family education, Taping, Joint mobilization, Spinal mobilization, Cryotherapy, and Moist heat  PLAN FOR NEXT SESSION:  Assess response to HEP; progress therex as indicated; use of modalities, manual therapy; and TPDN as indicated.    Nylia Gavina MS, PT 05/22/2024 3:32 PM       "

## 2024-05-24 ENCOUNTER — Ambulatory Visit
